# Patient Record
Sex: Female | Born: 1980 | Race: White | Hispanic: No | Marital: Single | Smoking: Current every day smoker
Health system: Southern US, Community
[De-identification: ages and names within clinical notes are randomized; demographics above are authoritative.]

## PROBLEM LIST (undated history)

## (undated) DIAGNOSIS — J302 Other seasonal allergic rhinitis: Secondary | ICD-10-CM

## (undated) DIAGNOSIS — E7800 Pure hypercholesterolemia, unspecified: Secondary | ICD-10-CM

## (undated) DIAGNOSIS — E079 Disorder of thyroid, unspecified: Secondary | ICD-10-CM

## (undated) DIAGNOSIS — G35 Multiple sclerosis: Secondary | ICD-10-CM

## (undated) DIAGNOSIS — F209 Schizophrenia, unspecified: Secondary | ICD-10-CM

## (undated) DIAGNOSIS — F3110 Bipolar disorder, current episode manic without psychotic features, unspecified: Secondary | ICD-10-CM

## (undated) HISTORY — PX: CHOLECYSTECTOMY: SHX55

---

## 2016-04-13 ENCOUNTER — Inpatient Hospital Stay (HOSPITAL_COMMUNITY)
Admission: EM | Admit: 2016-04-13 | Discharge: 2016-04-18 | DRG: 060 | Disposition: A | Discharge status: Home Health | Payer: Medicare Other | Attending: Internal Medicine | Admitting: Internal Medicine

## 2016-04-13 ENCOUNTER — Emergency Department (HOSPITAL_COMMUNITY): Payer: Medicare Other

## 2016-04-13 ENCOUNTER — Encounter (HOSPITAL_COMMUNITY): Payer: Self-pay | Admitting: Emergency Medicine

## 2016-04-13 DIAGNOSIS — G35 Multiple sclerosis: Principal | ICD-10-CM | POA: Diagnosis present

## 2016-04-13 DIAGNOSIS — J302 Other seasonal allergic rhinitis: Secondary | ICD-10-CM | POA: Diagnosis present

## 2016-04-13 DIAGNOSIS — E7800 Pure hypercholesterolemia, unspecified: Secondary | ICD-10-CM | POA: Diagnosis present

## 2016-04-13 DIAGNOSIS — F209 Schizophrenia, unspecified: Secondary | ICD-10-CM | POA: Diagnosis present

## 2016-04-13 DIAGNOSIS — F319 Bipolar disorder, unspecified: Secondary | ICD-10-CM | POA: Diagnosis present

## 2016-04-13 DIAGNOSIS — Z6834 Body mass index (BMI) 34.0-34.9, adult: Secondary | ICD-10-CM

## 2016-04-13 DIAGNOSIS — R531 Weakness: Secondary | ICD-10-CM | POA: Diagnosis present

## 2016-04-13 DIAGNOSIS — E039 Hypothyroidism, unspecified: Secondary | ICD-10-CM | POA: Diagnosis present

## 2016-04-13 DIAGNOSIS — Z23 Encounter for immunization: Secondary | ICD-10-CM

## 2016-04-13 DIAGNOSIS — Z7951 Long term (current) use of inhaled steroids: Secondary | ICD-10-CM | POA: Diagnosis not present

## 2016-04-13 DIAGNOSIS — E663 Overweight: Secondary | ICD-10-CM | POA: Diagnosis present

## 2016-04-13 DIAGNOSIS — F17210 Nicotine dependence, cigarettes, uncomplicated: Secondary | ICD-10-CM | POA: Diagnosis present

## 2016-04-13 DIAGNOSIS — H499 Unspecified paralytic strabismus: Secondary | ICD-10-CM | POA: Diagnosis present

## 2016-04-13 DIAGNOSIS — H5500 Unspecified nystagmus: Secondary | ICD-10-CM | POA: Diagnosis present

## 2016-04-13 DIAGNOSIS — Z91018 Allergy to other foods: Secondary | ICD-10-CM

## 2016-04-13 DIAGNOSIS — H50111 Monocular exotropia, right eye: Secondary | ICD-10-CM | POA: Diagnosis present

## 2016-04-13 DIAGNOSIS — T380X5A Adverse effect of glucocorticoids and synthetic analogues, initial encounter: Secondary | ICD-10-CM | POA: Diagnosis present

## 2016-04-13 DIAGNOSIS — R471 Dysarthria and anarthria: Secondary | ICD-10-CM | POA: Diagnosis present

## 2016-04-13 DIAGNOSIS — F200 Paranoid schizophrenia: Secondary | ICD-10-CM | POA: Diagnosis not present

## 2016-04-13 DIAGNOSIS — Z79899 Other long term (current) drug therapy: Secondary | ICD-10-CM

## 2016-04-13 DIAGNOSIS — R270 Ataxia, unspecified: Secondary | ICD-10-CM | POA: Diagnosis present

## 2016-04-13 DIAGNOSIS — E785 Hyperlipidemia, unspecified: Secondary | ICD-10-CM | POA: Diagnosis present

## 2016-04-13 HISTORY — DX: Multiple sclerosis: G35

## 2016-04-13 HISTORY — DX: Pure hypercholesterolemia, unspecified: E78.00

## 2016-04-13 HISTORY — DX: Bipolar disorder, current episode manic without psychotic features, unspecified: F31.10

## 2016-04-13 HISTORY — DX: Schizophrenia, unspecified: F20.9

## 2016-04-13 HISTORY — DX: Other seasonal allergic rhinitis: J30.2

## 2016-04-13 HISTORY — DX: Disorder of thyroid, unspecified: E07.9

## 2016-04-13 LAB — CBC WITH DIFFERENTIAL/PLATELET
BASOS PCT: 0 %
Basophils Absolute: 0 10*3/uL (ref 0.0–0.1)
Eosinophils Absolute: 0.1 10*3/uL (ref 0.0–0.7)
Eosinophils Relative: 1 %
HEMATOCRIT: 42.4 % (ref 36.0–46.0)
Hemoglobin: 14.6 g/dL (ref 12.0–15.0)
Lymphocytes Relative: 28 %
Lymphs Abs: 1.8 10*3/uL (ref 0.7–4.0)
MCH: 31.3 pg (ref 26.0–34.0)
MCHC: 34.4 g/dL (ref 30.0–36.0)
MCV: 91 fL (ref 78.0–100.0)
MONO ABS: 0.4 10*3/uL (ref 0.1–1.0)
MONOS PCT: 7 %
NEUTROS ABS: 4 10*3/uL (ref 1.7–7.7)
Neutrophils Relative %: 64 %
Platelets: 205 10*3/uL (ref 150–400)
RBC: 4.66 MIL/uL (ref 3.87–5.11)
RDW: 12.5 % (ref 11.5–15.5)
WBC: 6.3 10*3/uL (ref 4.0–10.5)

## 2016-04-13 LAB — MAGNESIUM: Magnesium: 2.1 mg/dL (ref 1.7–2.4)

## 2016-04-13 LAB — URINALYSIS, ROUTINE W REFLEX MICROSCOPIC
Bilirubin Urine: NEGATIVE
Glucose, UA: NEGATIVE mg/dL
Hgb urine dipstick: NEGATIVE
Ketones, ur: 15 mg/dL — AB
LEUKOCYTES UA: NEGATIVE
NITRITE: NEGATIVE
PROTEIN: NEGATIVE mg/dL
Specific Gravity, Urine: 1.01 (ref 1.005–1.030)
pH: 6 (ref 5.0–8.0)

## 2016-04-13 LAB — COMPREHENSIVE METABOLIC PANEL
ALBUMIN: 4.2 g/dL (ref 3.5–5.0)
ALT: 27 U/L (ref 14–54)
ANION GAP: 10 (ref 5–15)
AST: 26 U/L (ref 15–41)
Alkaline Phosphatase: 111 U/L (ref 38–126)
BILIRUBIN TOTAL: 0.5 mg/dL (ref 0.3–1.2)
BUN: 10 mg/dL (ref 6–20)
CO2: 20 mmol/L — ABNORMAL LOW (ref 22–32)
Calcium: 9.1 mg/dL (ref 8.9–10.3)
Chloride: 107 mmol/L (ref 101–111)
Creatinine, Ser: 0.82 mg/dL (ref 0.44–1.00)
GFR calc Af Amer: >60 mL/min (ref >60)
Glucose, Bld: 102 mg/dL — ABNORMAL HIGH (ref 65–99)
POTASSIUM: 3.6 mmol/L (ref 3.5–5.1)
Sodium: 137 mmol/L (ref 135–145)
TOTAL PROTEIN: 7.6 g/dL (ref 6.5–8.1)

## 2016-04-13 LAB — PHOSPHORUS: Phosphorus: 4.2 mg/dL (ref 2.5–4.6)

## 2016-04-13 LAB — TSH: TSH: 2.267 u[IU]/mL (ref 0.350–4.500)

## 2016-04-13 LAB — CK: CK TOTAL: 89 U/L (ref 38–234)

## 2016-04-13 LAB — VALPROIC ACID LEVEL: VALPROIC ACID LVL: 106 ug/mL — AB (ref 50.0–100.0)

## 2016-04-13 MED ORDER — LEVOTHYROXINE SODIUM 25 MCG PO TABS
25.0000 ug | ORAL_TABLET | Freq: Every day | ORAL | Status: DC
  Administered 2016-04-14 – 2016-04-18 (×5): 25 ug via ORAL
  Filled 2016-04-13 (×5): qty 1

## 2016-04-13 MED ORDER — POTASSIUM CHLORIDE IN NACL 20-0.9 MEQ/L-% IV SOLN
INTRAVENOUS | Status: DC
  Administered 2016-04-13 – 2016-04-16 (×8): via INTRAVENOUS
  Administered 2016-04-17: 1000 mL via INTRAVENOUS
  Administered 2016-04-17: 14:00:00 via INTRAVENOUS

## 2016-04-13 MED ORDER — LORATADINE 10 MG PO TABS
10.0000 mg | ORAL_TABLET | Freq: Every day | ORAL | Status: DC
  Administered 2016-04-14 – 2016-04-18 (×5): 10 mg via ORAL
  Filled 2016-04-13 (×5): qty 1

## 2016-04-13 MED ORDER — TOPIRAMATE 25 MG PO TABS
50.0000 mg | ORAL_TABLET | Freq: Three times a day (TID) | ORAL | Status: DC
  Administered 2016-04-13 – 2016-04-18 (×15): 50 mg via ORAL
  Filled 2016-04-13 (×16): qty 2

## 2016-04-13 MED ORDER — PERPHENAZINE 8 MG PO TABS
8.0000 mg | ORAL_TABLET | Freq: Two times a day (BID) | ORAL | Status: DC
  Administered 2016-04-13 – 2016-04-18 (×10): 8 mg via ORAL
  Filled 2016-04-13 (×12): qty 1

## 2016-04-13 MED ORDER — GADOBENATE DIMEGLUMINE 529 MG/ML IV SOLN
15.0000 mL | Freq: Once | INTRAVENOUS | Status: AC | PRN
  Administered 2016-04-13: 15 mL via INTRAVENOUS

## 2016-04-13 MED ORDER — FLUTICASONE PROPIONATE 50 MCG/ACT NA SUSP
2.0000 | Freq: Two times a day (BID) | NASAL | Status: DC
  Administered 2016-04-14 – 2016-04-18 (×8): 2 via NASAL
  Filled 2016-04-13: qty 16

## 2016-04-13 MED ORDER — SODIUM CHLORIDE 0.9 % IV SOLN
1000.0000 mg | INTRAVENOUS | Status: DC
  Administered 2016-04-13 – 2016-04-17 (×5): 1000 mg via INTRAVENOUS
  Filled 2016-04-13 (×6): qty 8

## 2016-04-13 MED ORDER — SODIUM CHLORIDE 0.9 % IV BOLUS (SEPSIS)
500.0000 mL | Freq: Once | INTRAVENOUS | Status: AC
  Administered 2016-04-13: 500 mL via INTRAVENOUS

## 2016-04-13 MED ORDER — ATORVASTATIN CALCIUM 10 MG PO TABS
10.0000 mg | ORAL_TABLET | Freq: Every day | ORAL | Status: DC
  Administered 2016-04-13 – 2016-04-17 (×5): 10 mg via ORAL
  Filled 2016-04-13 (×5): qty 1

## 2016-04-13 MED ORDER — INFLUENZA VAC SPLIT QUAD 0.5 ML IM SUSY
0.5000 mL | PREFILLED_SYRINGE | INTRAMUSCULAR | Status: AC
  Administered 2016-04-14: 0.5 mL via INTRAMUSCULAR
  Filled 2016-04-13: qty 0.5

## 2016-04-13 MED ORDER — HALOPERIDOL LACTATE 5 MG/ML IJ SOLN: 5.0000 mg | Freq: Four times a day (QID) | INTRAMUSCULAR | Status: DC | PRN

## 2016-04-13 MED ORDER — METHYLPREDNISOLONE SODIUM SUCC 1000 MG IJ SOLR
INTRAMUSCULAR | Status: AC
  Filled 2016-04-13: qty 8

## 2016-04-13 MED ORDER — ENOXAPARIN SODIUM 40 MG/0.4ML ~~LOC~~ SOLN
40.0000 mg | SUBCUTANEOUS | Status: DC
  Administered 2016-04-13 – 2016-04-17 (×5): 40 mg via SUBCUTANEOUS
  Filled 2016-04-13 (×5): qty 0.4

## 2016-04-13 MED ORDER — OLANZAPINE 5 MG PO TBDP
20.0000 mg | ORAL_TABLET | Freq: Every day | ORAL | Status: DC
  Administered 2016-04-13 – 2016-04-17 (×5): 20 mg via ORAL
  Filled 2016-04-13 (×5): qty 4
  Filled 2016-04-13: qty 2

## 2016-04-13 MED ORDER — DIVALPROEX SODIUM ER 500 MG PO TB24
1000.0000 mg | ORAL_TABLET | Freq: Every day | ORAL | Status: DC
  Administered 2016-04-13 – 2016-04-17 (×5): 1000 mg via ORAL
  Filled 2016-04-13 (×5): qty 2

## 2016-04-13 NOTE — Consult Note (Signed)
[place] A. [name], MD     [email or web address]          Denise Floyd is an 35 y.o. female.   ASSESSMENT/PLAN: 1. Multiple sclerosis with acute relapse presented with ophthalmoplegia, dysarthria and gait ataxia. MRI unfortunately shows severe disease burden thereby increasing the risk of long-term disability and severe neurological function. The patient will be given high-dose steroids Solu-Medrol 1 g. This would be done for 5 days given the severe burden seen on MRI. We will try to get records from her prior neurologist in [place]. Ultimately, she needs to be on disease modifying treatment. Given the heavy disease burden, I prefer that she be placed on Tysabri. This will be decided in outpatient setting however. We will attempt to obtain notes from her previous neurologist in [place].  2. Significant psychiatric problems including bipolar disorder with mania and the schizophrenia. Unfortunate, high-dose steroids have made these worse over the years especially bolus steroids given during relapses. We will therefore have to watch patient closely while she is getting high-dose steroids. We may have to give the patient as needed doses of Haldol or other neuroleptics help with mania or other symptoms that she will likely develop as a result of high-dose steroids.  3. Hypothyroidism.  4. Slightly elevated Depakote level. I will continue with the current dose however without reducing the dosing.    The patient is a 35 year old African female who has a history of multiple sclerosis. The history is obtained from the patient although very difficult. I did contact the mother who provided more information. Mother is from [place] in [place] which makes a history somewhat difficult. The patient probably was diagnosed multiple scratches at age of 35 years old. She was living in [place]. She did have a workup at that time. The patient informs me that she did have a spinal tap.  She has significant perseveration which makes the history difficult. The mother reports that the patient was given an injection. Exactly which injection this was is unclear but the patient had significant side effects from medication degree of muscle cramps. She therefore did not continue this. She seemed not to have been on consistent disease modifying therapy. The mother for his me that the patient has been getting doses of Solu-Medrol/steroids for her treatment whenever she has relapses. Unfortunately, this has caused the patient to develop psychiatric issues per the mother. She has had progressive deterioration in cognitive function especially with the high-dose steroids. The psychiatric problems seemed to worsen with steroid boluses. It appears the family moved from [place] to [place]. The patient tells me that she does have a college degree in [place] from a school in [place]. The patient has been in a group home facility for the past year. Mother relates this to me over the phone in tears that she had put her in a group home because she had difficulties taking care of the daughter with all the appointments needed. It appears over the last 2 weeks she's had significant speech impairment, ophthalmic problems and gait instability. This resulted the patient comes to the emergency room for further evaluation. The mother clearly reports that her speech has gotten worse when she saw her 2 weeks ago. She was clearly having what appears to be perseveration. It appears that there were some attempts made to get the patient on tecferida for there are one the newer oral agents but the patient got sick and this cannot be initiated. The patient complains of diplopia. She reports  having this about 3 weeks of the history is somewhat unreliable.    GENERAL: This a very pleasant overweight female in no acute distress. She seemed to have the typical [place] indifference from [place].  HEENT: Normal  ABDOMEN:  soft  EXTREMITIES: No edema   BACK: Normal  SKIN: Normal by inspection.    MENTAL STATUS: She is awake and alert. Again, she has severe perseveration which limits evaluation. Given this perseveration, she is unable to give a good history or follow commands consistently. She thinks she is in [place] at the hospital.  CRANIAL NERVES: Right pupil is 5 mm and briskly reactive. Left is 4 mm and reactive. She has a mild ptosis on the left. There is a marked right exotropia in neutral position. She has significant eye movement abnormalities with no upgaze. Downgaze is good. There is no movements of both eyes past midline to the left. She can cross midline with passive eye movements to about 80%. There is significant gaze evoked nystagmus. There is no flattening of the nasolabial folds; tongue is midline; uvula is midline; shoulder elevation is normal.  MOTOR: Normal tone, bulk and strength in the upper extremities; no pronator drift. Right hip flexion is 4/5. Dorsiflexion 5. Left lower extremity is 5/5 both dorsiflexion and plantar flexion.  COORDINATION: Left finger to nose is normal, right finger to nose is normal, No rest tremor; no intention tremor; no postural tremor; no bradykinesia.  REFLEXES: Deep tendon reflexes are symmetrical and normal. Babinski reflexes are flexor bilaterally.   SENSATION: Normal to light touch.  GAIT: This is slightly unsteady.     The brain MRI is reviewed in person. There are multiple large plaques seen involving both hemispheres. There are mostly perpendicular to the ventricle. I counted about 30 large plaques. This includes a large midline midbrain plaque affecting both sides. There is also confluent periventricular leukoencephalopathy. This involves most of the posterior horn of the lateral ventricle. No contrast enhancement is appreciated. No hemorrhage. No black spots/encephalomalacia appreciated.   Blood pressure 125/89, pulse 81, temperature 97.7 F  (36.5 C), temperature source Oral, resp. rate 18, height 5' 5"  (1.651 m), weight 205 lb 4 oz (93.1 kg), SpO2 99 %.  Past Medical History:  Diagnosis Date  . Bipolar I disorder with mania ([place])   . High cholesterol   . Multiple sclerosis ([place])   . Schizophrenia ([place])   . Seasonal allergies   . Thyroid disease     Past Surgical History:  Procedure Laterality Date  . CHOLECYSTECTOMY      History reviewed. No pertinent family history.  Social History:  reports that she has been smoking Cigarettes.  She has been smoking about 0.50 packs per day. She has never used smokeless tobacco. She reports that she does not drink alcohol or use drugs.  Allergies:  Allergies  Allergen Reactions  . Pineapple Nausea And Vomiting    Medications: Prior to Admission medications   Medication Sig Start Date End Date Taking? Authorizing Provider  atorvastatin (LIPITOR) 10 MG tablet Take 10 mg by mouth at bedtime.   Yes Historical Provider, MD  cetirizine (ZYRTEC) 10 MG tablet Take 10 mg by mouth daily.   Yes Historical Provider, MD  divalproex (DEPAKOTE ER) 500 MG 24 hr tablet Take 1,000 mg by mouth at bedtime.   Yes Historical Provider, MD  fluticasone (FLONASE) 50 MCG/ACT nasal spray Place 2 sprays into both nostrils 2 (two) times daily.   Yes Historical Provider, MD  levothyroxine (SYNTHROID, LEVOTHROID)  25 MCG tablet Take 25 mcg by mouth daily.   Yes Historical Provider, MD  OLANZapine zydis (ZYPREXA) 20 MG disintegrating tablet Take 20 mg by mouth at bedtime.   Yes Historical Provider, MD  perphenazine (TRILAFON) 4 MG tablet Take 8 mg by mouth 2 (two) times daily.   Yes Historical Provider, MD  topiramate (TOPAMAX) 50 MG tablet Take 50 mg by mouth 3 (three) times daily.   Yes Historical Provider, MD    Scheduled Meds: . atorvastatin  10 mg Oral QHS  . divalproex  1,000 mg Oral QHS  . enoxaparin (LOVENOX) injection  40 mg Subcutaneous Q24H  . fluticasone  2 spray Each Nare BID  . [START ON  04/14/2016] Influenza vac split quadrivalent PF  0.5 mL Intramuscular Tomorrow-1000  . [START ON 04/14/2016] levothyroxine  25 mcg Oral QAC breakfast  . [START ON 04/14/2016] loratadine  10 mg Oral Daily  . OLANZapine zydis  20 mg Oral QHS  . perphenazine  8 mg Oral BID  . topiramate  50 mg Oral TID   Continuous Infusions: . 0.9 % NaCl with KCl 20 mEq / L 125 mL/hr at 04/13/16 1749   PRN Meds:.     Results for orders placed or performed during the hospital encounter of 04/13/16 (from the past 48 hour(s))  Urinalysis, Routine w reflex microscopic (not at [facility])     Status: Abnormal   Collection Time: 04/13/16  2:00 PM  Result Value Ref Range   Color, Urine YELLOW YELLOW   APPearance CLEAR CLEAR   Specific Gravity, Urine 1.010 1.005 - 1.030   pH 6.0 5.0 - 8.0   Glucose, UA NEGATIVE NEGATIVE mg/dL   Hgb urine dipstick NEGATIVE NEGATIVE   Bilirubin Urine NEGATIVE NEGATIVE   Ketones, ur 15 (A) NEGATIVE mg/dL   Protein, ur NEGATIVE NEGATIVE mg/dL   Nitrite NEGATIVE NEGATIVE   Leukocytes, UA NEGATIVE NEGATIVE    Comment: MICROSCOPIC NOT DONE ON URINES WITH NEGATIVE PROTEIN, BLOOD, LEUKOCYTES, NITRITE, OR GLUCOSE <1000 mg/dL.  Magnesium     Status: None   Collection Time: 04/13/16  2:00 PM  Result Value Ref Range   Magnesium 2.1 1.7 - 2.4 mg/dL  Phosphorus     Status: None   Collection Time: 04/13/16  2:00 PM  Result Value Ref Range   Phosphorus 4.2 2.5 - 4.6 mg/dL  Valproic acid level     Status: Abnormal   Collection Time: 04/13/16  2:00 PM  Result Value Ref Range   Valproic Acid Lvl 106 (H) 50.0 - 100.0 ug/mL  CK     Status: None   Collection Time: 04/13/16  2:00 PM  Result Value Ref Range   Total CK 89 38 - 234 U/L  CBC with Differential/Platelet     Status: None   Collection Time: 04/13/16  2:36 PM  Result Value Ref Range   WBC 6.3 4.0 - 10.5 K/uL   RBC 4.66 3.87 - 5.11 MIL/uL   Hemoglobin 14.6 12.0 - 15.0 g/dL   HCT 42.4 36.0 - 46.0 %   MCV 91.0 78.0 - 100.0 fL   MCH  31.3 26.0 - 34.0 pg   MCHC 34.4 30.0 - 36.0 g/dL   RDW 12.5 11.5 - 15.5 %   Platelets 205 150 - 400 K/uL   Neutrophils Relative % 64 %   Neutro Abs 4.0 1.7 - 7.7 K/uL   Lymphocytes Relative 28 %   Lymphs Abs 1.8 0.7 - 4.0 K/uL   Monocytes Relative 7 %  Monocytes Absolute 0.4 0.1 - 1.0 K/uL   Eosinophils Relative 1 %   Eosinophils Absolute 0.1 0.0 - 0.7 K/uL   Basophils Relative 0 %   Basophils Absolute 0.0 0.0 - 0.1 K/uL  Comprehensive metabolic panel     Status: Abnormal   Collection Time: 04/13/16  2:36 PM  Result Value Ref Range   Sodium 137 135 - 145 mmol/L   Potassium 3.6 3.5 - 5.1 mmol/L   Chloride 107 101 - 111 mmol/L   CO2 20 (L) 22 - 32 mmol/L   Glucose, Bld 102 (H) 65 - 99 mg/dL   BUN 10 6 - 20 mg/dL   Creatinine, Ser 0.82 0.44 - 1.00 mg/dL   Calcium 9.1 8.9 - 10.3 mg/dL   Total Protein 7.6 6.5 - 8.1 g/dL   Albumin 4.2 3.5 - 5.0 g/dL   AST 26 15 - 41 U/L   ALT 27 14 - 54 U/L   Alkaline Phosphatase 111 38 - 126 U/L   Total Bilirubin 0.5 0.3 - 1.2 mg/dL   GFR calc non Af Amer >60 >60 mL/min   GFR calc Af Amer >60 >60 mL/min    Comment: (NOTE) The eGFR has been calculated using the CKD EPI equation. This calculation has not been validated in all clinical situations. eGFR's persistently <60 mL/min signify possible Chronic Kidney Disease.    Anion gap 10 5 - 15  TSH     Status: None   Collection Time: 04/13/16  2:36 PM  Result Value Ref Range   TSH 2.267 0.350 - 4.500 uIU/mL    Studies/Results:    BRAIN MRI COMPARISON:  None.  FINDINGS: The study is mildly motion degraded.  Brain: There is no evidence of acute infarct, intracranial hemorrhage, mass, midline shift, or extra-axial fluid collection. The ventricles are slightly prominent in size for age and may reflect mild cerebral volume loss. There are patchy T2 hyperintensities in the cerebral white matter bilaterally which are more confluent in the periventricular regions. Multiple lesions  are oriented perpendicularly to the lateral ventricles. There are juxtacortical lesions. There are lesions in the left middle cerebellar peduncle and brainstem, including a prominent 1.2 cm lesion in the midbrain. A left frontal lobe lesion demonstrates T2 shine through on diffusion imaging. No enhancing lesions are identified.  Vascular: Major intracranial vascular flow voids are preserved.  Skull and upper cervical spine: Unremarkable bone marrow signal.  Sinuses/Orbits: Orbits are unremarkable on this nondedicated study. Minimal paranasal sinus mucosal thickening. Clear mastoid air cells.  Other: None.  IMPRESSION: Moderately extensive white matter lesions consistent with provided history of multiple sclerosis. No evidence of active demyelination           Luria Rosario A. [name], M.D.  Diplomate, Tax adviser of Psychiatry and Neurology ( Neurology). 04/13/2016, 6:17 PM

## 2016-04-13 NOTE — H&P (Signed)
History and Physical    Denise Floyd ZOX:[id number] DOB: 1980/10/31 DOA: 04/13/2016  PCP: No primary care provider on file.   Patient coming from: Group Home.  Chief Complaint: Generalized weakness.  HPI: Denise Floyd is a 35 y.o. female with medical history significant of bipolar 1 disorder, schizophrenia, hypothyroidism, hyperlipidemia, seasonal allergies, Multiple sclerosis who is brought to the ER via [facility] from her group home due to generalized weakness for more than week.   History is difficult to obtain due to the patient's symptoms and speech repetition. Per Ms. Meyer Cory from the group home [phone]), since last week the patient has had progressively worse strabismus, generalized weakness, speech difficulty and repetition. No trauma, fever or any other symptoms noticed. She has had normal appetite and sleep.   ED Course: Work up in the ER showed a normal CBC, unremarkable CMP with mildly decreased CO2 level at 20 mmol/L and mild non- fasting hyperglycemia at 102 mg/dL. MRI of brain did not show any active demyelination, but shows extensive white matter lesions consistent with her MS history.   Review of Systems: As per HPI otherwise 10 point review of systems negative.    Past Medical History:  Diagnosis Date  . Bipolar I disorder with mania ([facility])   . High cholesterol   . Multiple sclerosis ([facility])   . Schizophrenia ([facility])   . Seasonal allergies   . Thyroid disease     Past Surgical History:  Procedure Laterality Date  . CHOLECYSTECTOMY       reports that she has been smoking Cigarettes.  She has been smoking about 0.50 packs per day. She has never used smokeless tobacco. She reports that she does not drink alcohol or use drugs.  Allergies  Allergen Reactions  . Pineapple Nausea And Vomiting   Family history Unable to obtain from the patient since she is constantly repeating the same things often.   Prior to Admission medications     Medication Sig Start Date End Date Taking? Authorizing Provider  atorvastatin (LIPITOR) 10 MG tablet Take 10 mg by mouth at bedtime.   Yes Historical Provider, MD  cetirizine (ZYRTEC) 10 MG tablet Take 10 mg by mouth daily.   Yes Historical Provider, MD  divalproex (DEPAKOTE ER) 500 MG 24 hr tablet Take 1,000 mg by mouth at bedtime.   Yes Historical Provider, MD  fluticasone (FLONASE) 50 MCG/ACT nasal spray Place 2 sprays into both nostrils 2 (two) times daily.   Yes Historical Provider, MD  levothyroxine (SYNTHROID, LEVOTHROID) 25 MCG tablet Take 25 mcg by mouth daily.   Yes Historical Provider, MD  OLANZapine zydis (ZYPREXA) 20 MG disintegrating tablet Take 20 mg by mouth at bedtime.   Yes Historical Provider, MD  perphenazine (TRILAFON) 4 MG tablet Take 8 mg by mouth 2 (two) times daily.   Yes Historical Provider, MD  topiramate (TOPAMAX) 50 MG tablet Take 50 mg by mouth 3 (three) times daily.   Yes Historical Provider, MD    Physical Exam:  Constitutional: NAD, calm, comfortable Vitals:   04/13/16 1326 04/13/16 1408 04/13/16 1430 04/13/16 1500  BP: 127/81 107/72 104/70 110/82  Pulse: 88 85 87 81  Resp: 17 18 18 18   Temp: 97.8 F (36.6 C)     TempSrc: Oral     SpO2: 100% 100% 100% 100%  Weight: 83.9 kg (185 lb)     Height: 5\' 5"  (1.651 m)      Eyes: PERRL, lids and conjunctivae normal ENMT: Mucous membranes are moist.  Posterior pharynx clear of any exudate or lesions. Dentition in fair-good condition and shows previous signs of repair on lower molar.  Neck: normal, supple, no masses, no thyromegaly Respiratory: clear to auscultation bilaterally, no wheezing, no crackles. Normal respiratory effort. No accessory muscle use.  Cardiovascular: Regular rate and rhythm, no murmurs / rubs / gallops. No extremity edema. 2+ pedal pulses. No carotid bruits.  Abdomen: no tenderness, no masses palpated. No hepatosplenomegaly. Bowel sounds positive.  Musculoskeletal: no clubbing / cyanosis. No  joint deformity upper and lower extremities. Good ROM, no contractures. Normal muscle tone.  Skin: no rashes, lesions, ulcers. No induration Neurologic: Sitting in wheelchair, positive strabismus, generalized weakness, intact coordination with nose to finger, unable to evaluate gait. Psychiatric:  No restlessness, but patient seems frustrated with symptoms. Speech repetition and difficulty. Unable to fully evaluate.   Labs on Admission: I have personally reviewed following labs and imaging studies  CBC:  Recent Labs Lab 04/13/16 1436  WBC 6.3  NEUTROABS 4.0  HGB 14.6  HCT 42.4  MCV 91.0  PLT 205   Basic Metabolic Panel:  Recent Labs Lab 04/13/16 1436  NA 137  K 3.6  CL 107  CO2 20*  GLUCOSE 102*  BUN 10  CREATININE 0.82  CALCIUM 9.1   GFR: Estimated Creatinine Clearance: 103.5 mL/min (by C-G formula based on SCr of 0.82 mg/dL). Liver Function Tests:  Recent Labs Lab 04/13/16 1436  AST 26  ALT 27  ALKPHOS 111  BILITOT 0.5  PROT 7.6  ALBUMIN 4.2   No results for input(s): LIPASE, AMYLASE in the last 168 hours. No results for input(s): AMMONIA in the last 168 hours. Coagulation Profile: No results for input(s): INR, PROTIME in the last 168 hours. Cardiac Enzymes: No results for input(s): CKTOTAL, CKMB, CKMBINDEX, TROPONINI in the last 168 hours. BNP (last 3 results) No results for input(s): PROBNP in the last 8760 hours. HbA1C: No results for input(s): HGBA1C in the last 72 hours. CBG: No results for input(s): GLUCAP in the last 168 hours. Lipid Profile: No results for input(s): CHOL, HDL, LDLCALC, TRIG, CHOLHDL, LDLDIRECT in the last 72 hours. Thyroid Function Tests: No results for input(s): TSH, T4TOTAL, FREET4, T3FREE, THYROIDAB in the last 72 hours. Anemia Panel: No results for input(s): VITAMINB12, FOLATE, FERRITIN, TIBC, IRON, RETICCTPCT in the last 72 hours. Urine analysis: No results found for: COLORURINE, APPEARANCEUR, LABSPEC, PHURINE,  GLUCOSEU, HGBUR, BILIRUBINUR, KETONESUR, PROTEINUR, UROBILINOGEN, NITRITE, LEUKOCYTESUR   Radiological Exams on Admission:  CLINICAL DATA:  Weakness over the past week with difficulty ambulating. History of multiple sclerosis.  EXAM: MRI HEAD WITHOUT AND WITH CONTRAST  TECHNIQUE: Multiplanar, multiecho pulse sequences of the brain and surrounding structures were obtained without and with intravenous contrast.  CONTRAST:  15mL MULTIHANCE GADOBENATE DIMEGLUMINE 529 MG/ML IV SOLN  COMPARISON:  None.  FINDINGS: The study is mildly motion degraded.  Brain: There is no evidence of acute infarct, intracranial hemorrhage, mass, midline shift, or extra-axial fluid collection. The ventricles are slightly prominent in size for age and may reflect mild cerebral volume loss. There are patchy T2 hyperintensities in the cerebral white matter bilaterally which are more confluent in the periventricular regions. Multiple lesions are oriented perpendicularly to the lateral ventricles. There are juxtacortical lesions. There are lesions in the left middle cerebellar peduncle and brainstem, including a prominent 1.2 cm lesion in the midbrain. A left frontal lobe lesion demonstrates T2 shine through on diffusion imaging. No enhancing lesions are identified.  Vascular: Major intracranial vascular flow  voids are preserved.  Skull and upper cervical spine: Unremarkable bone marrow signal.  Sinuses/Orbits: Orbits are unremarkable on this nondedicated study. Minimal paranasal sinus mucosal thickening. Clear mastoid air cells.  Other: None.  IMPRESSION: Moderately extensive white matter lesions consistent with provided history of multiple sclerosis. No evidence of active demyelination.   Electronically Signed   By: Sebastian Ache M.D.   On: 04/13/2016 17:14    Assessment/Plan Principal Problem:   Multiple sclerosis exacerbation ([facility]) History difficult to obtain due to  patient's symptoms. Admit to MedSurg/ Inpatient. Neuro checks every 4 hours. Solumedrol 1 gr IVPB every 24 hrs x 3 days. Check phosphorus, total CK, valproic acid level. Neurology is on the case.Marland Kitchen   Active Problems:   Hyperlipidemia Continue atorvastatin 10 mg po daily. Check total CK level to rule out rhabdomyolysis as a cause weakness. Monitor LFT's periodically.    Schizophrenia (HCC) Continue pherphenazine 8 mg po bid. Continue Olanzapine 20 mg po at bed time. Haldol 5 mg PRN for corticosteroids induced restlessness.    Bipolar 1 disorder ([facility]CC) Continue valproic acid. Check valproic acid level.    Seasonal allergies Continue Zyrtec or formulary equivalent.    Hypothyroidism Continue Levothyroxine 25 mcg po daily. Continue interval TSH monitoring.   DVT prophylaxis: Lovenox SQ. Code Status: Full code. Family Communication:  Disposition Plan: Admit for  Consults called: Neurology (Dr. Judithann Sheen) Admission status: Inpatient/Medsurg.   Bobette Mo MD [facility] Pager [phone].  If 7PM-7AM, please contact night-coverage [email or web address] Password [email or web address]  04/13/2016, 4:13 PM

## 2016-04-13 NOTE — ED Provider Notes (Signed)
D/w Dr. Robb Matarrtiz - will admit   Eber HongBrian Myers [place], MD 04/13/16 [phone]

## 2016-04-13 NOTE — ED Notes (Signed)
PT lives at [facility] (group home) and Meyer Cory was updated that pt would be admitted to room 334. Phone number for group home [phone].

## 2016-04-13 NOTE — ED Triage Notes (Signed)
[facility] brought pt in today with generalized weakness for over a week from a group home. PT states she has multiple sclerosis and needs steroids and feels weak all over and had problems walking for the past few days. PT denies any pain at this time.

## 2016-04-13 NOTE — ED Provider Notes (Signed)
[facility]Y DEPT Provider Note   CSN: [id number] Arrival date & time: 04/13/16  1322     History   Chief Complaint Chief Complaint  Patient presents with  . Weakness    HPI Denise Floyd is a 35 y.o. female.  Patient with MS history, schizophrenia, lives in a group home presents for general weakness has been worsening over the past week. No focal deficits patient. No new medications. Patient has had this in the past and required steroids. Patient is had problems walking for the past few days. No current neurologist known.      Past Medical History:  Diagnosis Date  . Bipolar I disorder with mania (HCC)   . High cholesterol   . Multiple sclerosis (HCC)   . Schizophrenia (HCC)   . Seasonal allergies   . Thyroid disease     There are no active problems to display for this patient.   Past Surgical History:  Procedure Laterality Date  . CHOLECYSTECTOMY      OB History    Gravida Para Term Preterm AB Living   0 0 0 0 0 0   SAB TAB Ectopic Multiple Live Births   0 0 0 0 0       Home Medications    Prior to Admission medications   Medication Sig Start Date End Date Taking? Authorizing Provider  atorvastatin (LIPITOR) 10 MG tablet Take 10 mg by mouth at bedtime.   Yes Historical Provider, MD  cetirizine (ZYRTEC) 10 MG tablet Take 10 mg by mouth daily.   Yes Historical Provider, MD  divalproex (DEPAKOTE ER) 500 MG 24 hr tablet Take 1,000 mg by mouth at bedtime.   Yes Historical Provider, MD  fluticasone (FLONASE) 50 MCG/ACT nasal spray Place 2 sprays into both nostrils 2 (two) times daily.   Yes Historical Provider, MD  levothyroxine (SYNTHROID, LEVOTHROID) 25 MCG tablet Take 25 mcg by mouth daily.   Yes Historical Provider, MD  OLANZapine zydis (ZYPREXA) 20 MG disintegrating tablet Take 20 mg by mouth at bedtime.   Yes Historical Provider, MD  perphenazine (TRILAFON) 4 MG tablet Take 8 mg by mouth 2 (two) times daily.   Yes Historical Provider, MD    topiramate (TOPAMAX) 50 MG tablet Take 50 mg by mouth 3 (three) times daily.   Yes Historical Provider, MD    Family History History reviewed. No pertinent family history.  Social History Social History  Substance Use Topics  . Smoking status: Current Every Day Smoker    Packs/day: 0.50    Types: Cigarettes  . Smokeless tobacco: Never Used  . Alcohol use No     Allergies   Pineapple   Review of Systems Review of Systems  Constitutional: Positive for fatigue. Negative for chills and fever.  HENT: Negative for congestion.   Eyes: Negative for visual disturbance.  Respiratory: Negative for shortness of breath.   Cardiovascular: Negative for chest pain.  Gastrointestinal: Negative for abdominal pain and vomiting.  Genitourinary: Negative for dysuria and flank pain.  Musculoskeletal: Negative for back pain, neck pain and neck stiffness.  Skin: Negative for rash.  Neurological: Positive for weakness. Negative for light-headedness and headaches.     Physical Exam Updated Vital Signs BP 107/72   Pulse 85   Temp 97.8 F (36.6 C) (Oral)   Resp 18   Ht 5\' 5"  (1.651 m)   Wt 185 lb (83.9 kg)   LMP  (LMP Unknown)   SpO2 100%   BMI 30.79 kg/m  Physical Exam  Constitutional: She is oriented to person, place, and time. She appears well-developed and well-nourished.  HENT:  Head: Normocephalic and atraumatic.  Eyes: Right eye exhibits no discharge. Left eye exhibits no discharge.  Neck: Normal range of motion. Neck supple. No tracheal deviation present.  Cardiovascular: Normal rate and regular rhythm.   Pulmonary/Chest: Effort normal and breath sounds normal.  Abdominal: Soft. She exhibits no distension. There is no tenderness. There is no guarding.  Musculoskeletal: She exhibits no edema.  Neurological: She is alert and oriented to person, place, and time.  General fatigue and weakness on exam. No focal weakness in extremities. Perrl.  Right eye difficulty with medial  movement, otherwise eomfi Sensation intact in all extremities.   Skin: Skin is warm. No rash noted.  Psychiatric: Her affect is blunt.  Flat affect  Nursing note and vitals reviewed.    ED Treatments / Results  Labs (all labs ordered are listed, but only abnormal results are displayed) Labs Reviewed  CBC WITH DIFFERENTIAL/PLATELET  COMPREHENSIVE METABOLIC PANEL  URINALYSIS, ROUTINE W REFLEX MICROSCOPIC (NOT AT [facility])    EKG  EKG Interpretation None       Radiology No results found.  Procedures Procedures (including critical care time) Emergency Ultrasound Study:   Angiocath insertion Performed by: Enid Skeens  Consent: Verbal consent obtained. Risks and benefits: risks, benefits and alternatives were discussed Immediately prior to procedure the correct patient, procedure, equipment, support staff and site/side marked as needed.  Indication: difficult IV access Preparation: Patient was prepped and draped in the usual sterile fashion. Vein Location: left ac  vein was visualized during assessment for potential access sites and was found to be patent/ easily compressed with linear ultrasound.  The needle was visualized with real-time ultrasound and guided into the vein. Gauge: 20 g  Image saved and stored.  Normal blood return.  Patient tolerance: Patient tolerated the procedure well with no immediate complications.     Medications Ordered in ED Medications  sodium chloride 0.9 % bolus 500 mL (not administered)     Initial Impression / Assessment and Plan / ED Course  I have reviewed the triage vital signs and the nursing notes.  Pertinent labs & imaging results that were available during my care of the patient were reviewed by me and considered in my medical decision making (see chart for details).  Clinical Course   Patient presents from group home with worsening general weakness and ambulation issues. Concern for MS flare however other differentials  as well. Plan for screening blood work, urinalysis and MRI brain with and without contrast. Patient possibly will need neurology consult.  Difficult IV ultrasound-guided. Patient's care be signed out to follow-up results. Discussed with Dr. Judithann Sheen neurology who agreed with MRI, blood work, urine and he will consult on the patient once admitted to the hospitalist. He recommends 1 g Solu-Medrol daily for 3 days after MRI. Patient's blood work and urinalysis and MRI pending.  Final Clinical Impressions(s) / ED Diagnoses   Final diagnoses:  MS (multiple sclerosis) (HCC)  Generalized weakness    New Prescriptions New Prescriptions   No medications on file     Blane Ohara, MD 04/13/16 1456

## 2016-04-13 NOTE — ED Notes (Signed)
Pt to MRI

## 2016-04-13 NOTE — ED Notes (Signed)
Report given to [place], RN for admission at this time. PT still in MRI

## 2016-04-14 DIAGNOSIS — F200 Paranoid schizophrenia: Secondary | ICD-10-CM

## 2016-04-14 LAB — BASIC METABOLIC PANEL
ANION GAP: 8 (ref 5–15)
BUN: 8 mg/dL (ref 6–20)
CALCIUM: 9 mg/dL (ref 8.9–10.3)
CO2: 20 mmol/L — AB (ref 22–32)
Chloride: 110 mmol/L (ref 101–111)
Creatinine, Ser: 0.8 mg/dL (ref 0.44–1.00)
GFR calc Af Amer: >60 mL/min (ref >60)
GFR calc non Af Amer: >60 mL/min (ref >60)
GLUCOSE: 165 mg/dL — AB (ref 65–99)
Potassium: 4 mmol/L (ref 3.5–5.1)
Sodium: 138 mmol/L (ref 135–145)

## 2016-04-14 LAB — CBC
HEMATOCRIT: 44.5 % (ref 36.0–46.0)
HEMOGLOBIN: 15.5 g/dL — AB (ref 12.0–15.0)
MCH: 31.6 pg (ref 26.0–34.0)
MCHC: 34.8 g/dL (ref 30.0–36.0)
MCV: 90.6 fL (ref 78.0–100.0)
Platelets: 194 10*3/uL (ref 150–400)
RBC: 4.91 MIL/uL (ref 3.87–5.11)
RDW: 12.5 % (ref 11.5–15.5)
WBC: 6.8 10*3/uL (ref 4.0–10.5)

## 2016-04-14 LAB — VITAMIN B12: VITAMIN B 12: 531 pg/mL (ref 180–914)

## 2016-04-14 LAB — C-REACTIVE PROTEIN: CRP: <0.8 mg/dL (ref <1.0)

## 2016-04-14 LAB — SEDIMENTATION RATE: Sed Rate: 3 mm/hr (ref 0–22)

## 2016-04-14 NOTE — Progress Notes (Signed)
Denise A. [name], MD     [email or web address]          Denise Floyd is an 35 y.o. female.   Assessment/Plan: 1. Multiple sclerosis with acute relapse presented with ophthalmoplegia, dysarthria and gait ataxia. MRI unfortunately shows severe disease burden thereby increasing the risk of long-term disability and severe neurological function. The patient will be given high-dose steroids Solu-Medrol 1 g. This would be done for 5 days given the severe burden seen on MRI. We will try to get records from her prior neurologist in [place]. Ultimately, she needs to be on disease modifying treatment. Given the heavy disease burden, I prefer that she be placed on Tysabri. This will be decided in outpatient setting however. We will attempt to obtain notes from her previous neurologist in [place].  2. Significant psychiatric problems including bipolar disorder with mania and the schizophrenia. Unfortunate, high-dose steroids have made these worse over the years especially bolus steroids given during relapses. We will therefore have to watch patient closely while she is getting high-dose steroids. We may have to give the patient as needed doses of Haldol or other neuroleptics help with mania or other symptoms that she will likely develop as a result of high-dose steroids.  3. Hypothyroidism.  4. Slightly elevated Depakote level. I will continue with the current dose however without reducing the dosing.    I will also go ahead and order the JC virus antibody to assess the risk of immunosuppression therapy for MS.   Consideration was given to use ACTH because of steroid-induced/worsening psychosis in this patient, but currently it is not available on formulary in the hospital.  We will also do additional labs for the following: ANA, C-reactive protein, ESR, vitamin B12 level, TSH and HIV.       She reports that her symptoms are slightly improved. She still has  diplopia.    GENERAL: This a very pleasant overweight female in no acute distress. She seemed to have the typical [place] indifference from [place].  HEENT: Normal  ABDOMEN: soft  EXTREMITIES: No edema   BACK: Normal  SKIN: Normal by inspection.    MENTAL STATUS: She is awake and alert. Again, she has severe perseveration which limits evaluation. Given this perseveration, she is unable to give a good history or follow commands consistently. She thinks she is in [place] at the hospital.  CRANIAL NERVES: Right pupil is 5 mm and briskly reactive. Left is 4 mm and reactive. She has a mild ptosis on the left. There is a marked right exotropia in neutral position. She has significant eye movement abnormalities with no upgaze. Downgaze is good. There is no movements of both eyes past midline to the left. She can cross midline with passive eye movements to about 80%. There is significant gaze evoked nystagmus. There is no flattening of the nasolabial folds; tongue is midline; uvula is midline; shoulder elevation is normal.  MOTOR: Normal tone, bulk and strength in the upper extremities; no pronator drift. Right hip flexion is 4/5. Dorsiflexion 5. Left lower extremity is 5/5 both dorsiflexion and plantar flexion.  COORDINATION: Left finger to nose is normal, right finger to nose is normal, No rest tremor; no intention tremor; no postural tremor; no bradykinesia.  REFLEXES: Deep tendon reflexes are symmetrical and normal. Babinski reflexes are flexor bilaterally.   SENSATION: Normal to light touch.  GAIT: This is slightly unsteady.      Objective: Vital signs in last 24 hours: Temp:  [97.7 F (36.5  C)-98.2 F (36.8 C)] 98.2 F (36.8 C) (10/04 0522) Pulse Rate:  [81-90] 82 (10/04 0522) Resp:  [16-18] 18 (10/04 0522) BP: (104-131)/(70-89) 130/70 (10/04 0522) SpO2:  [99 %-100 %] 100 % (10/04 0522) Weight:  [185 lb (83.9 kg)-205 lb 4 oz (93.1 kg)] 205 lb 4 oz (93.1 kg)  (10/03 1740)  Intake/Output from previous day: 10/03 0701 - 10/04 0700 In: 1882.9 [P.O.:360; I.V.:1022.9; IV Piggyback:500] Out: 700 [Urine:700] Intake/Output this shift: No intake/output data recorded. Nutritional status: Diet heart healthy/carb modified Room service appropriate? Yes; Fluid consistency: Thin   Lab Results: Results for orders placed or performed during the hospital encounter of 04/13/16 (from the past 48 hour(s))  Urinalysis, Routine w reflex microscopic (not at [facility])     Status: Abnormal   Collection Time: 04/13/16  2:00 PM  Result Value Ref Range   Color, Urine YELLOW YELLOW   APPearance CLEAR CLEAR   Specific Gravity, Urine 1.010 1.005 - 1.030   pH 6.0 5.0 - 8.0   Glucose, UA NEGATIVE NEGATIVE mg/dL   Hgb urine dipstick NEGATIVE NEGATIVE   Bilirubin Urine NEGATIVE NEGATIVE   Ketones, ur 15 (A) NEGATIVE mg/dL   Protein, ur NEGATIVE NEGATIVE mg/dL   Nitrite NEGATIVE NEGATIVE   Leukocytes, UA NEGATIVE NEGATIVE    Comment: MICROSCOPIC NOT DONE ON URINES WITH NEGATIVE PROTEIN, BLOOD, LEUKOCYTES, NITRITE, OR GLUCOSE <1000 mg/dL.  Magnesium     Status: None   Collection Time: 04/13/16  2:00 PM  Result Value Ref Range   Magnesium 2.1 1.7 - 2.4 mg/dL  Phosphorus     Status: None   Collection Time: 04/13/16  2:00 PM  Result Value Ref Range   Phosphorus 4.2 2.5 - 4.6 mg/dL  Valproic acid level     Status: Abnormal   Collection Time: 04/13/16  2:00 PM  Result Value Ref Range   Valproic Acid Lvl 106 (H) 50.0 - 100.0 ug/mL  CK     Status: None   Collection Time: 04/13/16  2:00 PM  Result Value Ref Range   Total CK 89 38 - 234 U/L  CBC with Differential/Platelet     Status: None   Collection Time: 04/13/16  2:36 PM  Result Value Ref Range   WBC 6.3 4.0 - 10.5 K/uL   RBC 4.66 3.87 - 5.11 MIL/uL   Hemoglobin 14.6 12.0 - 15.0 g/dL   HCT 42.4 36.0 - 46.0 %   MCV 91.0 78.0 - 100.0 fL   MCH 31.3 26.0 - 34.0 pg   MCHC 34.4 30.0 - 36.0 g/dL   RDW 12.5 11.5 - 15.5 %     Platelets 205 150 - 400 K/uL   Neutrophils Relative % 64 %   Neutro Abs 4.0 1.7 - 7.7 K/uL   Lymphocytes Relative 28 %   Lymphs Abs 1.8 0.7 - 4.0 K/uL   Monocytes Relative 7 %   Monocytes Absolute 0.4 0.1 - 1.0 K/uL   Eosinophils Relative 1 %   Eosinophils Absolute 0.1 0.0 - 0.7 K/uL   Basophils Relative 0 %   Basophils Absolute 0.0 0.0 - 0.1 K/uL  Comprehensive metabolic panel     Status: Abnormal   Collection Time: 04/13/16  2:36 PM  Result Value Ref Range   Sodium 137 135 - 145 mmol/L   Potassium 3.6 3.5 - 5.1 mmol/L   Chloride 107 101 - 111 mmol/L   CO2 20 (L) 22 - 32 mmol/L   Glucose, Bld 102 (H) 65 - 99 mg/dL  BUN 10 6 - 20 mg/dL   Creatinine, Ser 0.82 0.44 - 1.00 mg/dL   Calcium 9.1 8.9 - 10.3 mg/dL   Total Protein 7.6 6.5 - 8.1 g/dL   Albumin 4.2 3.5 - 5.0 g/dL   AST 26 15 - 41 U/L   ALT 27 14 - 54 U/L   Alkaline Phosphatase 111 38 - 126 U/L   Total Bilirubin 0.5 0.3 - 1.2 mg/dL   GFR calc non Af Amer >60 >60 mL/min   GFR calc Af Amer >60 >60 mL/min    Comment: (NOTE) The eGFR has been calculated using the CKD EPI equation. This calculation has not been validated in all clinical situations. eGFR's persistently <60 mL/min signify possible Chronic Kidney Disease.    Anion gap 10 5 - 15  TSH     Status: None   Collection Time: 04/13/16  2:36 PM  Result Value Ref Range   TSH 2.267 0.350 - 4.500 uIU/mL  Basic metabolic panel     Status: Abnormal   Collection Time: 04/14/16  7:39 AM  Result Value Ref Range   Sodium 138 135 - 145 mmol/L   Potassium 4.0 3.5 - 5.1 mmol/L   Chloride 110 101 - 111 mmol/L   CO2 20 (L) 22 - 32 mmol/L   Glucose, Bld 165 (H) 65 - 99 mg/dL   BUN 8 6 - 20 mg/dL   Creatinine, Ser 0.80 0.44 - 1.00 mg/dL   Calcium 9.0 8.9 - 10.3 mg/dL   GFR calc non Af Amer >60 >60 mL/min   GFR calc Af Amer >60 >60 mL/min    Comment: (NOTE) The eGFR has been calculated using the CKD EPI equation. This calculation has not been validated in all clinical  situations. eGFR's persistently <60 mL/min signify possible Chronic Kidney Disease.    Anion gap 8 5 - 15  CBC     Status: Abnormal   Collection Time: 04/14/16  7:39 AM  Result Value Ref Range   WBC 6.8 4.0 - 10.5 K/uL   RBC 4.91 3.87 - 5.11 MIL/uL   Hemoglobin 15.5 (H) 12.0 - 15.0 g/dL   HCT 44.5 36.0 - 46.0 %   MCV 90.6 78.0 - 100.0 fL   MCH 31.6 26.0 - 34.0 pg   MCHC 34.8 30.0 - 36.0 g/dL   RDW 12.5 11.5 - 15.5 %   Platelets 194 150 - 400 K/uL    Lipid Panel No results for input(s): CHOL, TRIG, HDL, CHOLHDL, VLDL, LDLCALC in the last 72 hours.  Studies/Results:   Medications:  Scheduled Meds: . atorvastatin  10 mg Oral QHS  . divalproex  1,000 mg Oral QHS  . enoxaparin (LOVENOX) injection  40 mg Subcutaneous Q24H  . fluticasone  2 spray Each Nare BID  . levothyroxine  25 mcg Oral QAC breakfast  . loratadine  10 mg Oral Daily  . methylPREDNISolone (SOLU-MEDROL) injection  1,000 mg Intravenous Q24H  . OLANZapine zydis  20 mg Oral QHS  . perphenazine  8 mg Oral BID  . topiramate  50 mg Oral TID   Continuous Infusions: . 0.9 % NaCl with KCl 20 mEq / L 125 mL/hr at 04/14/16 0230   PRN Meds:.haloperidol lactate     LOS: 1 day   Shatonia Hoots A. Merlene Floyd, M.D.  Diplomate, Tax adviser of Psychiatry and Neurology ( Neurology).  '

## 2016-04-14 NOTE — Clinical Social Work Note (Signed)
Clinical Social Work Assessment  Patient Details  Name: Denise Floyd MRN: [id number] Date of Birth: 21-May-1981  Date of referral:  04/14/16               Reason for consult:  Discharge Planning                Permission sought to share information with:  Guardian, Facility Sport and exercise psychologist Permission granted to share information::  Yes, Verbal Permission Granted  Name::        Agency::  Faithful Companion  Relationship::  mother  Contact Information:     Housing/Transportation Living arrangements for the past 2 months:  [place] of Information:  Patient, Facility Patient Interpreter Needed:  None Criminal Activity/Legal Involvement Pertinent to Current Situation/Hospitalization:  No - Comment as needed Significant Relationships:  Parents Lives with:  Facility Resident Do you feel safe going back to the place where you live?  Yes Need for family participation in patient care:  Yes (Comment)  Care giving concerns:  None reported. Pt is resident at group home.    Social Worker assessment / plan:  CSW met with pt at bedside. Pt alert and oriented, but was unable to recall name of facility she is from. Pt repeated statements multiple times. She states she likes it there and plans to return. Pt said her mother is her legal guardian. CSW left voicemail for mother requesting return call. Per Danae Chen at [facility], pt has been a resident there for about a year. Facility is now called [facility], but was [facility]. No home health prior to admission and okay to return. Pt requires assist with bathing. She came to ED due to weakness. Admitted due to MS exacerbation.   Employment status:  Disabled (Comment on whether or not currently receiving Disability) Insurance information:  Medicare PT Recommendations:  Not assessed at this time Information / Referral to community resources:  Other (Comment Required) (Return to [facility])  Patient/Family's  Response to care:  Pt plans to return to [facility] when medically stable.   Patient/Family's Understanding of and Emotional Response to Diagnosis, Current Treatment, and Prognosis:  Pt shared that her mother was not aware she was in the hospital. CSW left voicemail to discuss d/c planning.   Emotional Assessment Appearance:  Appears stated age Attitude/Demeanor/Rapport:  Other (Cooperative) Affect (typically observed):  Appropriate Orientation:  Oriented to Self, Oriented to Place, Oriented to Situation, Oriented to  Time Alcohol / Substance use:  Not Applicable Psych involvement (Current and /or in the community):  No (Comment)  Discharge Needs  Concerns to be addressed:  Discharge Planning Concerns Readmission within the last 30 days:  No Current discharge risk:  None Barriers to Discharge:  Continued Medical Work up   [employer], [place] 04/14/2016, 10:39 AM [phone]

## 2016-04-14 NOTE — Progress Notes (Signed)
PROGRESS NOTE    Denise Floyd  OIT:[id number] DOB: 1980-09-07 DOA: 04/13/2016 PCP: No primary care provider on file.     Brief Narrative:  35 year old woman admitted from a group home on 10/3. She has a history of multiple sclerosis. Had been complaining of increased weakness and double vision. Seen by neurology and is being treated with high-dose IV steroids for a presumed MS flare.   Assessment & Plan:   Principal Problem:   Multiple sclerosis exacerbation (HCC) Active Problems:   Hyperlipidemia   Schizophrenia (HCC)   Bipolar 1 disorder (HCC)   Seasonal allergies   Hypothyroidism   Multiple sclerosis exacerbation -Seen by neurology with plans for 5 days of IV steroids, high dose.  Schizophrenia -Continue perphenazine and Zyprexa as well as when necessary Haldol. -Her mood is currently stable, but has a history of steroid-induced psychosis and will need to be monitored closely.  Hypothyroidism -Continue Synthroid   DVT prophylaxis: Lovenox Code Status: Full code Family Communication: Patient only Disposition Plan: Resume back to group home once IV steroids completed  Consultants:   Neurology  Procedures:   None  Antimicrobials:   None    Subjective: Lying in bed, feels weak, appears confused  Objective: Vitals:   04/13/16 1700 04/13/16 1740 04/13/16 2038 04/14/16 0522  BP: 117/79 125/89 131/86 130/70  Pulse: 82 81 90 82  Resp: 16 18 16 18   Temp: 98 F (36.7 C) 97.7 F (36.5 C) 98 F (36.7 C) 98.2 F (36.8 C)  TempSrc:  Oral Oral Oral  SpO2: 99% 99% 100% 100%  Weight:  93.1 kg (205 lb 4 oz)    Height:  5\' 5"  (1.651 m)      Intake/Output Summary (Last 24 hours) at 04/14/16 1624 Last data filed at 04/14/16 1500  Gross per 24 hour  Intake          4485.42 ml  Output             1100 ml  Net          3385.42 ml   Filed Weights   04/13/16 1326 04/13/16 1740  Weight: 83.9 kg (185 lb) 93.1 kg (205 lb 4 oz)    Examination:  General  exam: Oriented to person and place, not to time, appears confused. Respiratory system: Clear to auscultation. Respiratory effort normal. Cardiovascular system:RRR. No murmurs, rubs, gallops. Gastrointestinal system: Abdomen is nondistended, soft and nontender. No organomegaly or masses felt. Normal bowel sounds heard. 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 Extremities: No C/C/E, +pedal pulses Skin: No rashes, lesions or ulcers     Data Reviewed: I have personally reviewed following labs and imaging studies  CBC:  Recent Labs Lab 04/13/16 1436 04/14/16 0739  WBC 6.3 6.8  NEUTROABS 4.0  --   HGB 14.6 15.5*  HCT 42.4 44.5  MCV 91.0 90.6  PLT 205 194   Basic Metabolic Panel:  Recent Labs Lab 04/13/16 1400 04/13/16 1436 04/14/16 0739  NA  --  137 138  K  --  3.6 4.0  CL  --  107 110  CO2  --  20* 20*  GLUCOSE  --  102* 165*  BUN  --  10 8  CREATININE  --  0.82 0.80  CALCIUM  --  9.1 9.0  MG 2.1  --   --   PHOS 4.2  --   --    GFR: Estimated Creatinine Clearance: 111.7 mL/min (by C-G formula based on SCr of 0.8  mg/dL). Liver Function Tests:  Recent Labs Lab 04/13/16 1436  AST 26  ALT 27  ALKPHOS 111  BILITOT 0.5  PROT 7.6  ALBUMIN 4.2   No results for input(s): LIPASE, AMYLASE in the last 168 hours. No results for input(s): AMMONIA in the last 168 hours. Coagulation Profile: No results for input(s): INR, PROTIME in the last 168 hours. Cardiac Enzymes:  Recent Labs Lab 04/13/16 1400  CKTOTAL 89   BNP (last 3 results) No results for input(s): PROBNP in the last 8760 hours. HbA1C: No results for input(s): HGBA1C in the last 72 hours. CBG: No results for input(s): GLUCAP in the last 168 hours. Lipid Profile: No results for input(s): CHOL, HDL, LDLCALC, TRIG, CHOLHDL, LDLDIRECT in the last 72 hours. Thyroid Function Tests:  Recent Labs  04/13/16 1436  TSH 2.267   Anemia Panel:  Recent Labs  04/14/16 0946    VITAMINB12 531   Urine analysis:    Component Value Date/Time   COLORURINE YELLOW 04/13/2016 1400   APPEARANCEUR CLEAR 04/13/2016 1400   LABSPEC 1.010 04/13/2016 1400   PHURINE 6.0 04/13/2016 1400   GLUCOSEU NEGATIVE 04/13/2016 1400   HGBUR NEGATIVE 04/13/2016 1400   BILIRUBINUR NEGATIVE 04/13/2016 1400   KETONESUR 15 (A) 04/13/2016 1400   PROTEINUR NEGATIVE 04/13/2016 1400   NITRITE NEGATIVE 04/13/2016 1400   LEUKOCYTESUR NEGATIVE 04/13/2016 1400   Sepsis Labs: @LABRCNTIP (procalcitonin:4,lacticidven:4)  )No results found for this or any previous visit (from the past 240 hour(s)).       Radiology Studies: Mr Laqueta JeanBrain W Or Wo Contrast  Result Date: 04/13/2016 CLINICAL DATA:  Weakness over the past week with difficulty ambulating. History of multiple sclerosis. EXAM: MRI HEAD WITHOUT AND WITH CONTRAST TECHNIQUE: Multiplanar, multiecho pulse sequences of the brain and surrounding structures were obtained without and with intravenous contrast. CONTRAST:  15mL MULTIHANCE GADOBENATE DIMEGLUMINE 529 MG/ML IV SOLN COMPARISON:  None. FINDINGS: The study is mildly motion degraded. Brain: There is no evidence of acute infarct, intracranial hemorrhage, mass, midline shift, or extra-axial fluid collection. The ventricles are slightly prominent in size for age and may reflect mild cerebral volume loss. There are patchy T2 hyperintensities in the cerebral white matter bilaterally which are more confluent in the periventricular regions. Multiple lesions are oriented perpendicularly to the lateral ventricles. There are juxtacortical lesions. There are lesions in the left middle cerebellar peduncle and brainstem, including a prominent 1.2 cm lesion in the midbrain. A left frontal lobe lesion demonstrates T2 shine through on diffusion imaging. No enhancing lesions are identified. Vascular: Major intracranial vascular flow voids are preserved. Skull and upper cervical spine: Unremarkable bone marrow signal.  Sinuses/Orbits: Orbits are unremarkable on this nondedicated study. Minimal paranasal sinus mucosal thickening. Clear mastoid air cells. Other: None. IMPRESSION: Moderately extensive white matter lesions consistent with provided history of multiple sclerosis. No evidence of active demyelination. Electronically Signed   By: Sebastian AcheAllen  Grady M.D.   On: 04/13/2016 17:14        Scheduled Meds: . atorvastatin  10 mg Oral QHS  . divalproex  1,000 mg Oral QHS  . enoxaparin (LOVENOX) injection  40 mg Subcutaneous Q24H  . fluticasone  2 spray Each Nare BID  . levothyroxine  25 mcg Oral QAC breakfast  . loratadine  10 mg Oral Daily  . methylPREDNISolone (SOLU-MEDROL) injection  1,000 mg Intravenous Q24H  . OLANZapine zydis  20 mg Oral QHS  . perphenazine  8 mg Oral BID  . topiramate  50 mg Oral TID  Continuous Infusions: . 0.9 % NaCl with KCl 20 mEq / L 125 mL/hr at 04/14/16 0939     LOS: 1 day    Time spent: 25 minutes. Greater than 50% of this time was spent in direct contact with the patient coordinating care.     Chaya Jan, MD [facility] Pager [phone]  If 7PM-7AM, please contact night-coverage [email or web address] Password [email or web address] 04/14/2016, 4:24 PM

## 2016-04-14 NOTE — Care Management Note (Signed)
Case Management Note  Patient Details  Name: Denise Floyd MRN: [id number] Date of Birth: 1981-05-12  Subjective/Objective:                  Pt admitted with MS flare. She is from [facility]. She is ind with ADL's. She is not active with any HH serivces PTA. She plans to return to [facility] at [place]. CSW is aware of living situation and will make arrangements for return to facility at [place].   Action/Plan: No CM needs anticipated. Will cont to follow.   Expected Discharge Date:  04/15/16               Expected Discharge Plan:  Group Home  In-House Referral:  Clinical Social Work  Discharge planning Services  CM Consult  Post Acute Care Choice:    Choice offered to:     DME Arranged:    DME Agency:     HH Arranged:    HH Agency:     Status of Service:  In process, will continue to follow  If discussed at Long Length of Stay Meetings, dates discussed:    Additional Comments:  Malcolm Metro, RN 04/14/2016, 2:21 PM

## 2016-04-14 NOTE — Care Management Important Message (Signed)
Important Message  Patient Details  Name: Denise Floyd MRN: [id number] Date of Birth: 1981/05/03   Medicare Important Message Given:  Yes    Malcolm Metro, RN 04/14/2016, 2:20 PM

## 2016-04-15 LAB — RPR: RPR: NONREACTIVE

## 2016-04-15 LAB — HIV ANTIBODY (ROUTINE TESTING W REFLEX): HIV Screen 4th Generation wRfx: NONREACTIVE

## 2016-04-15 LAB — GLUCOSE, CAPILLARY: GLUCOSE-CAPILLARY: 187 mg/dL — AB (ref 65–99)

## 2016-04-15 LAB — HOMOCYSTEINE: Homocysteine: 3.4 umol/L (ref 0.0–15.0)

## 2016-04-15 NOTE — Progress Notes (Signed)
Patient difficult to arouse. Vital signs stable, CBG 187. Able to arouse patient after sternal rub and repeating her name. Dr. Ardyth Harps notified.

## 2016-04-15 NOTE — Progress Notes (Signed)
PROGRESS NOTE    Denise Floyd  ZOX:[id number] DOB: September 09, 1980 DOA: 04/13/2016 PCP: No primary care provider on file.     Brief Narrative:  35 year old woman admitted from a group home on 10/3. She has a history of multiple sclerosis. Had been complaining of increased weakness and double vision. Seen by neurology and is being treated with high-dose IV steroids for a presumed MS flare.   Assessment & Plan:   Principal Problem:   Multiple sclerosis exacerbation (HCC) Active Problems:   Hyperlipidemia   Schizophrenia (HCC)   Bipolar 1 disorder (HCC)   Seasonal allergies   Hypothyroidism   Multiple sclerosis exacerbation -Seen by neurology with plans for 5 days of IV steroids, high dose.  Schizophrenia -Continue perphenazine and Zyprexa as well as when necessary Haldol. -Her mood is currently stable, but has a history of steroid-induced psychosis and will need to be monitored closely. -Per RN was a bit lethargic this am. When I went to assess her, she was awake, eating breakfast and able to answer simple questions appropriately, altho somewhat sleepy.  Hypothyroidism -Continue Synthroid   DVT prophylaxis: Lovenox Code Status: Full code Family Communication: Patient only Disposition Plan: Resume back to group home once IV steroids completed  Consultants:   Neurology  Procedures:   None  Antimicrobials:   None    Subjective: Lying in bed, feels weak, appears sleepy  Objective: Vitals:   04/13/16 2038 04/14/16 0522 04/14/16 2258 04/15/16 0636  BP: 131/86 130/70 124/83 (!) 106/59  Pulse: 90 82 81 93  Resp: 16 18 18 15   Temp: 98 F (36.7 C) 98.2 F (36.8 C) 98.3 F (36.8 C) 98.6 F (37 C)  TempSrc: Oral Oral Oral Oral  SpO2: 100% 100% 100% 99%  Weight:      Height:        Intake/Output Summary (Last 24 hours) at 04/15/16 1345 Last data filed at 04/15/16 1324  Gross per 24 hour  Intake           2122.5 ml  Output                2 ml  Net            2120.5 ml   Filed Weights   04/13/16 1326 04/13/16 1740  Weight: 83.9 kg (185 lb) 93.1 kg (205 lb 4 oz)    Examination:  General exam: Oriented to person and place, not to time, appears confused. Respiratory system: Clear to auscultation. Respiratory effort normal. Cardiovascular system:RRR. No murmurs, rubs, gallops. Gastrointestinal system: Abdomen is nondistended, soft and nontender. No organomegaly or masses felt. Normal bowel sounds heard. 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 Extremities: No C/C/E, +pedal pulses Skin: No rashes, lesions or ulcers     Data Reviewed: I have personally reviewed following labs and imaging studies  CBC:  Recent Labs Lab 04/13/16 1436 04/14/16 0739  WBC 6.3 6.8  NEUTROABS 4.0  --   HGB 14.6 15.5*  HCT 42.4 44.5  MCV 91.0 90.6  PLT 205 194   Basic Metabolic Panel:  Recent Labs Lab 04/13/16 1400 04/13/16 1436 04/14/16 0739  NA  --  137 138  K  --  3.6 4.0  CL  --  107 110  CO2  --  20* 20*  GLUCOSE  --  102* 165*  BUN  --  10 8  CREATININE  --  0.82 0.80  CALCIUM  --  9.1 9.0  MG 2.1  --   --  PHOS 4.2  --   --    GFR: Estimated Creatinine Clearance: 111.7 mL/min (by C-G formula based on SCr of 0.8 mg/dL). Liver Function Tests:  Recent Labs Lab 04/13/16 1436  AST 26  ALT 27  ALKPHOS 111  BILITOT 0.5  PROT 7.6  ALBUMIN 4.2   No results for input(s): LIPASE, AMYLASE in the last 168 hours. No results for input(s): AMMONIA in the last 168 hours. Coagulation Profile: No results for input(s): INR, PROTIME in the last 168 hours. Cardiac Enzymes:  Recent Labs Lab 04/13/16 1400  CKTOTAL 89   BNP (last 3 results) No results for input(s): PROBNP in the last 8760 hours. HbA1C: No results for input(s): HGBA1C in the last 72 hours. CBG:  Recent Labs Lab 04/15/16 0832  GLUCAP 187*   Lipid Profile: No results for input(s): CHOL, HDL, LDLCALC, TRIG, CHOLHDL, LDLDIRECT in  the last 72 hours. Thyroid Function Tests:  Recent Labs  04/13/16 1436  TSH 2.267   Anemia Panel:  Recent Labs  04/14/16 0946  VITAMINB12 531   Urine analysis:    Component Value Date/Time   COLORURINE YELLOW 04/13/2016 1400   APPEARANCEUR CLEAR 04/13/2016 1400   LABSPEC 1.010 04/13/2016 1400   PHURINE 6.0 04/13/2016 1400   GLUCOSEU NEGATIVE 04/13/2016 1400   HGBUR NEGATIVE 04/13/2016 1400   BILIRUBINUR NEGATIVE 04/13/2016 1400   KETONESUR 15 (A) 04/13/2016 1400   PROTEINUR NEGATIVE 04/13/2016 1400   NITRITE NEGATIVE 04/13/2016 1400   LEUKOCYTESUR NEGATIVE 04/13/2016 1400   Sepsis Labs: @LABRCNTIP (procalcitonin:4,lacticidven:4)  )No results found for this or any previous visit (from the past 240 hour(s)).       Radiology Studies: Mr Laqueta JeanBrain W Or Wo Contrast  Result Date: 04/13/2016 CLINICAL DATA:  Weakness over the past week with difficulty ambulating. History of multiple sclerosis. EXAM: MRI HEAD WITHOUT AND WITH CONTRAST TECHNIQUE: Multiplanar, multiecho pulse sequences of the brain and surrounding structures were obtained without and with intravenous contrast. CONTRAST:  15mL MULTIHANCE GADOBENATE DIMEGLUMINE 529 MG/ML IV SOLN COMPARISON:  None. FINDINGS: The study is mildly motion degraded. Brain: There is no evidence of acute infarct, intracranial hemorrhage, mass, midline shift, or extra-axial fluid collection. The ventricles are slightly prominent in size for age and may reflect mild cerebral volume loss. There are patchy T2 hyperintensities in the cerebral white matter bilaterally which are more confluent in the periventricular regions. Multiple lesions are oriented perpendicularly to the lateral ventricles. There are juxtacortical lesions. There are lesions in the left middle cerebellar peduncle and brainstem, including a prominent 1.2 cm lesion in the midbrain. A left frontal lobe lesion demonstrates T2 shine through on diffusion imaging. No enhancing lesions are  identified. Vascular: Major intracranial vascular flow voids are preserved. Skull and upper cervical spine: Unremarkable bone marrow signal. Sinuses/Orbits: Orbits are unremarkable on this nondedicated study. Minimal paranasal sinus mucosal thickening. Clear mastoid air cells. Other: None. IMPRESSION: Moderately extensive white matter lesions consistent with provided history of multiple sclerosis. No evidence of active demyelination. Electronically Signed   By: Sebastian AcheAllen  Grady M.D.   On: 04/13/2016 17:14        Scheduled Meds: . atorvastatin  10 mg Oral QHS  . divalproex  1,000 mg Oral QHS  . enoxaparin (LOVENOX) injection  40 mg Subcutaneous Q24H  . fluticasone  2 spray Each Nare BID  . levothyroxine  25 mcg Oral QAC breakfast  . loratadine  10 mg Oral Daily  . methylPREDNISolone (SOLU-MEDROL) injection  1,000 mg Intravenous Q24H  . OLANZapine  zydis  20 mg Oral QHS  . perphenazine  8 mg Oral BID  . topiramate  50 mg Oral TID   Continuous Infusions: . 0.9 % NaCl with KCl 20 mEq / L 125 mL/hr at 04/15/16 0815     LOS: 2 days    Time spent: 25 minutes. Greater than 50% of this time was spent in direct contact with the patient coordinating care.     Chaya Jan, MD [facility] Pager [phone]  If 7PM-7AM, please contact night-coverage [email or web address] Password [email or web address] 04/15/2016, 1:45 PM

## 2016-04-15 NOTE — Progress Notes (Signed)
[place] A. [name], MD     [email or web address]          Yulia Ulrich is an 35 y.o. female.   Assessment/Plan: 1. Multiple sclerosis with acute relapse presented with ophthalmoplegia, dysarthria and gait ataxia. MRI unfortunately shows severe disease burden thereby increasing the risk of long-term disability and severe neurological function. The patient will be given high-dose steroids Solu-Medrol 1 g. This would be done for 5 days given the severe burden seen on MRI. We will try to get records from her prior neurologist in [place]. Ultimately, she needs to be on disease modifying treatment. Given the heavy disease burden, I prefer that she be placed on Tysabri. This will be decided in outpatient setting however. We will attempt to obtain notes from her previous neurologist in [place].  2. Significant psychiatric problems including bipolar disorder with mania and the schizophrenia. Unfortunate, high-dose steroids have made these worse over the years especially bolus steroids given during relapses. We will therefore have to watch patient closely while she is getting high-dose steroids. We may have to give the patient as needed doses of Haldol or other neuroleptics help with mania or other symptoms that she will likely develop as a result of high-dose steroids.  3. Hypothyroidism.                She reports that her symptoms are same. She still has diplopia.    GENERAL: Drowsy.  HEENT: Normal  ABDOMEN: soft  EXTREMITIES: No edema   BACK: Normal  SKIN: Normal by inspection.    MENTAL STATUS: She is quite drowsy this morning. She reports that she's overall same. She tells that she slept okay although she is drowsy this morning.  CRANIAL NERVES: Right pupil is 5 mm and briskly reactive. Left is 4 mm and reactive. She has a mild ptosis on the left. There is a marked right exotropia in neutral position. She has significant eye  movement abnormalities with no upgaze. Downgaze is good. There is no movements of both eyes past midline to the left. She can cross midline with passive eye movements to about 80%. There is significant gaze evoked nystagmus. There is no flattening of the nasolabial folds; tongue is midline; uvula is midline; shoulder elevation is normal.  MOTOR: Normal tone, bulk and strength in the upper extremities; no pronator drift. Right hip flexion is 4/5. Dorsiflexion 5. Left lower extremity is 5/5 both dorsiflexion and plantar flexion.  COORDINATION: Left finger to nose is normal, right finger to nose is normal, No rest tremor; no intention tremor; no postural tremor; no bradykinesia.  REFLEXES: Deep tendon reflexes are symmetrical and normal. Babinski reflexes are flexor bilaterally.   SENSATION: Normal to light touch.  GAIT: This is slightly unsteady.      Objective: Vital signs in last 24 hours: Temp:  [98.3 F (36.8 C)-98.6 F (37 C)] 98.6 F (37 C) (10/05 0636) Pulse Rate:  [81-93] 93 (10/05 0636) Resp:  [15-18] 15 (10/05 0636) BP: (106-124)/(59-83) 106/59 (10/05 0636) SpO2:  [99 %-100 %] 99 % (10/05 0636)  Intake/Output from previous day: 10/04 0701 - 10/05 0700 In: 2842.5 [P.O.:1280; I.V.:1562.5] Out: 402 [Urine:402] Intake/Output this shift: No intake/output data recorded. Nutritional status: Diet heart healthy/carb modified Room service appropriate? Yes; Fluid consistency: Thin   Lab Results: Results for orders placed or performed during the hospital encounter of 04/13/16 (from the past 48 hour(s))  Urinalysis, Routine w reflex microscopic (not at [facility])     Status: Abnormal  Collection Time: 04/13/16  2:00 PM  Result Value Ref Range   Color, Urine YELLOW YELLOW   APPearance CLEAR CLEAR   Specific Gravity, Urine 1.010 1.005 - 1.030   pH 6.0 5.0 - 8.0   Glucose, UA NEGATIVE NEGATIVE mg/dL   Hgb urine dipstick NEGATIVE NEGATIVE   Bilirubin Urine NEGATIVE NEGATIVE     Ketones, ur 15 (A) NEGATIVE mg/dL   Protein, ur NEGATIVE NEGATIVE mg/dL   Nitrite NEGATIVE NEGATIVE   Leukocytes, UA NEGATIVE NEGATIVE    Comment: MICROSCOPIC NOT DONE ON URINES WITH NEGATIVE PROTEIN, BLOOD, LEUKOCYTES, NITRITE, OR GLUCOSE <1000 mg/dL.  Magnesium     Status: None   Collection Time: 04/13/16  2:00 PM  Result Value Ref Range   Magnesium 2.1 1.7 - 2.4 mg/dL  Phosphorus     Status: None   Collection Time: 04/13/16  2:00 PM  Result Value Ref Range   Phosphorus 4.2 2.5 - 4.6 mg/dL  Valproic acid level     Status: Abnormal   Collection Time: 04/13/16  2:00 PM  Result Value Ref Range   Valproic Acid Lvl 106 (H) 50.0 - 100.0 ug/mL  CK     Status: None   Collection Time: 04/13/16  2:00 PM  Result Value Ref Range   Total CK 89 38 - 234 U/L  CBC with Differential/Platelet     Status: None   Collection Time: 04/13/16  2:36 PM  Result Value Ref Range   WBC 6.3 4.0 - 10.5 K/uL   RBC 4.66 3.87 - 5.11 MIL/uL   Hemoglobin 14.6 12.0 - 15.0 g/dL   HCT 42.4 36.0 - 46.0 %   MCV 91.0 78.0 - 100.0 fL   MCH 31.3 26.0 - 34.0 pg   MCHC 34.4 30.0 - 36.0 g/dL   RDW 12.5 11.5 - 15.5 %   Platelets 205 150 - 400 K/uL   Neutrophils Relative % 64 %   Neutro Abs 4.0 1.7 - 7.7 K/uL   Lymphocytes Relative 28 %   Lymphs Abs 1.8 0.7 - 4.0 K/uL   Monocytes Relative 7 %   Monocytes Absolute 0.4 0.1 - 1.0 K/uL   Eosinophils Relative 1 %   Eosinophils Absolute 0.1 0.0 - 0.7 K/uL   Basophils Relative 0 %   Basophils Absolute 0.0 0.0 - 0.1 K/uL  Comprehensive metabolic panel     Status: Abnormal   Collection Time: 04/13/16  2:36 PM  Result Value Ref Range   Sodium 137 135 - 145 mmol/L   Potassium 3.6 3.5 - 5.1 mmol/L   Chloride 107 101 - 111 mmol/L   CO2 20 (L) 22 - 32 mmol/L   Glucose, Bld 102 (H) 65 - 99 mg/dL   BUN 10 6 - 20 mg/dL   Creatinine, Ser 0.82 0.44 - 1.00 mg/dL   Calcium 9.1 8.9 - 10.3 mg/dL   Total Protein 7.6 6.5 - 8.1 g/dL   Albumin 4.2 3.5 - 5.0 g/dL   AST 26 15 - 41  U/L   ALT 27 14 - 54 U/L   Alkaline Phosphatase 111 38 - 126 U/L   Total Bilirubin 0.5 0.3 - 1.2 mg/dL   GFR calc non Af Amer >60 >60 mL/min   GFR calc Af Amer >60 >60 mL/min    Comment: (NOTE) The eGFR has been calculated using the CKD EPI equation. This calculation has not been validated in all clinical situations. eGFR's persistently <60 mL/min signify possible Chronic Kidney Disease.    Anion gap 10  5 - 15  TSH     Status: None   Collection Time: 04/13/16  2:36 PM  Result Value Ref Range   TSH 2.267 0.350 - 4.500 uIU/mL  Basic metabolic panel     Status: Abnormal   Collection Time: 04/14/16  7:39 AM  Result Value Ref Range   Sodium 138 135 - 145 mmol/L   Potassium 4.0 3.5 - 5.1 mmol/L   Chloride 110 101 - 111 mmol/L   CO2 20 (L) 22 - 32 mmol/L   Glucose, Bld 165 (H) 65 - 99 mg/dL   BUN 8 6 - 20 mg/dL   Creatinine, Ser 0.80 0.44 - 1.00 mg/dL   Calcium 9.0 8.9 - 10.3 mg/dL   GFR calc non Af Amer >60 >60 mL/min   GFR calc Af Amer >60 >60 mL/min    Comment: (NOTE) The eGFR has been calculated using the CKD EPI equation. This calculation has not been validated in all clinical situations. eGFR's persistently <60 mL/min signify possible Chronic Kidney Disease.    Anion gap 8 5 - 15  CBC     Status: Abnormal   Collection Time: 04/14/16  7:39 AM  Result Value Ref Range   WBC 6.8 4.0 - 10.5 K/uL   RBC 4.91 3.87 - 5.11 MIL/uL   Hemoglobin 15.5 (H) 12.0 - 15.0 g/dL   HCT 44.5 36.0 - 46.0 %   MCV 90.6 78.0 - 100.0 fL   MCH 31.6 26.0 - 34.0 pg   MCHC 34.8 30.0 - 36.0 g/dL   RDW 12.5 11.5 - 15.5 %   Platelets 194 150 - 400 K/uL  RPR     Status: None   Collection Time: 04/14/16  9:46 AM  Result Value Ref Range   RPR Ser Ql Non Reactive Non Reactive    Comment: (NOTE) Performed At: [facility] [address] [place], [place] [id number] Lindon Romp MD YQ:[id number]   Vitamin B12     Status: None   Collection Time: 04/14/16  9:46 AM  Result Value Ref Range     Vitamin B-12 531 180 - 914 pg/mL    Comment: (NOTE) This assay is not validated for testing neonatal or myeloproliferative syndrome specimens for Vitamin B12 levels. Performed at [facility]   C-reactive protein     Status: None   Collection Time: 04/14/16  9:46 AM  Result Value Ref Range   CRP <0.8 <1.0 mg/dL    Comment: Performed at [facility]  Sedimentation rate     Status: None   Collection Time: 04/14/16  9:46 AM  Result Value Ref Range   Sed Rate 3 0 - 22 mm/hr  HIV antibody (routine testing) (NOT for [facility])     Status: None   Collection Time: 04/14/16  9:46 AM  Result Value Ref Range   HIV Screen 4th Generation wRfx Non Reactive Non Reactive    Comment: (NOTE) Performed At: [facility] [address] [place], [place] [id number] Lindon Romp MD RJ:[id number]     Lipid Panel No results for input(s): CHOL, TRIG, HDL, CHOLHDL, VLDL, LDLCALC in the last 72 hours.  Studies/Results:   Medications:  Scheduled Meds: . atorvastatin  10 mg Oral QHS  . divalproex  1,000 mg Oral QHS  . enoxaparin (LOVENOX) injection  40 mg Subcutaneous Q24H  . fluticasone  2 spray Each Nare BID  . levothyroxine  25 mcg Oral QAC breakfast  . loratadine  10 mg Oral Daily  .  methylPREDNISolone (SOLU-MEDROL) injection  1,000 mg Intravenous Q24H  . OLANZapine zydis  20 mg Oral QHS  . perphenazine  8 mg Oral BID  . topiramate  50 mg Oral TID   Continuous Infusions: . 0.9 % NaCl with KCl 20 mEq / L 125 mL/hr at 04/15/16 0132   PRN Meds:.haloperidol lactate     LOS: 2 days   Sandra Brents A. [name], M.D.  Diplomate, Tax adviser of Psychiatry and Neurology ( Neurology).  '

## 2016-04-16 LAB — ANTINUCLEAR ANTIBODIES, IFA: ANA Ab, IFA: NEGATIVE

## 2016-04-16 NOTE — Care Management (Addendum)
HH PT ordered by MD for patient to have when she returns to Group Home. Patient does not have a PCP listed and will need one for [facility] referral. Spoke with group home and they state patient is seen by a NP.  I have called patient's mother and Purvis Kilts (group home administator), no answer. I am doubtful that [facility] PT can be arranged before discharged back to group home. Group Home number [phone] Purvis Kilts [phone]

## 2016-04-16 NOTE — Clinical Social Work Note (Signed)
CSW attempted to reach pt's mother/guardian again today and left another voicemail. Pt will likely be ready for d/c on Sunday. [place] at facility aware and agreeable. Requests that they be called at [phone] when pt is ready.  Derenda Fennel, LCSW [phone]

## 2016-04-16 NOTE — Progress Notes (Signed)
[facility] Kaleeyah Cuffie A. Gerilyn Pilgrim, MD     [email or web address]          Denise Floyd is an 35 y.o. female.   Assessment/Plan: 1. Multiple sclerosis with acute relapse presented with ophthalmoplegia, dysarthria and gait ataxia. MRI unfortunately shows severe disease burden thereby increasing the risk of long-term disability and severe neurological function. The patient will be given high-dose steroids Solu-Medrol 1 g. This would be done for 5 days given the severe burden seen on MRI. We will try to get records from her prior neurologist in [place]. Ultimately, she needs to be on disease modifying treatment. Given the heavy disease burden, I prefer that she be placed on Tysabri. This will be decided in outpatient setting however. We will attempt to obtain notes from her previous neurologist in [place].  2. Significant psychiatric problems including bipolar disorder with mania and the schizophrenia. Unfortunate, high-dose steroids have made these worse over the years especially bolus steroids given during relapses. We will therefore have to watch patient closely while she is getting high-dose steroids. We may have to give the patient as needed doses of Haldol or other neuroleptics help with mania or other symptoms that she will likely develop as a result of high-dose steroids.  3. Hypothyroidism.       She still drowsy this E line but more arousable than yesterday. She reports no complaints.        GENERAL: Drowsy.  HEENT: Normal  ABDOMEN: soft  EXTREMITIES: No edema   BACK: Normal  SKIN: Normal by inspection.    MENTAL STATUS: She is sleeping but easily arousable. We did have a recent conversation. She appears to be more responsive with her speech and less dysarthric. There is much less stuttering and perseveration.   CRANIAL NERVES: Right pupil is 5 mm and briskly reactive. Left is 4 mm and reactive. She has a mild ptosis on the left. There is a  marked right exotropia in neutral position. She has significant eye movement abnormalities with no upgaze. Downgaze is good. There is no movements of both eyes past midline to the left. She can cross midline with passive eye movements to about 80%. There is significant gaze evoked nystagmus. There is no flattening of the nasolabial folds; tongue is midline; uvula is midline; shoulder elevation is normal.  MOTOR: Normal tone, bulk and strength in the upper extremities; no pronator drift. Right hip flexion is 4/5. Dorsiflexion 5. Left lower extremity is 5/5 both dorsiflexion and plantar flexion.  COORDINATION: Left finger to nose is normal, right finger to nose is normal, No rest tremor; no intention tremor; no postural tremor; no bradykinesia.  REFLEXES: Deep tendon reflexes are symmetrical and normal. Babinski reflexes are flexor bilaterally.   SENSATION: Normal to light touch.  GAIT: This is slightly unsteady.      Objective: Vital signs in last 24 hours: Temp:  [97.8 F (36.6 C)-98.3 F (36.8 C)] 97.8 F (36.6 C) (10/06 1403) Pulse Rate:  [66-91] 91 (10/06 1403) Resp:  [15-18] 18 (10/06 1403) BP: (119-124)/(76-84) 119/76 (10/06 1403) SpO2:  [100 %] 100 % (10/06 1403)  Intake/Output from previous day: 10/05 0701 - 10/06 0700 In: 2859.3 [P.O.:560; I.V.:2183.3; IV Piggyback:116] Out: -  Intake/Output this shift: No intake/output data recorded. Nutritional status: Diet heart healthy/carb modified Room service appropriate? Yes; Fluid consistency: Thin   Lab Results: Results for orders placed or performed during the hospital encounter of 04/13/16 (from the past 48 hour(s))  Glucose, capillary     Status:  Abnormal   Collection Time: 04/15/16  8:32 AM  Result Value Ref Range   Glucose-Capillary 187 (H) 65 - 99 mg/dL    Lipid Panel No results for input(s): CHOL, TRIG, HDL, CHOLHDL, VLDL, LDLCALC in the last 72 hours.  Studies/Results:   Medications:  Scheduled  Meds: . atorvastatin  10 mg Oral QHS  . divalproex  1,000 mg Oral QHS  . enoxaparin (LOVENOX) injection  40 mg Subcutaneous Q24H  . fluticasone  2 spray Each Nare BID  . levothyroxine  25 mcg Oral QAC breakfast  . loratadine  10 mg Oral Daily  . methylPREDNISolone (SOLU-MEDROL) injection  1,000 mg Intravenous Q24H  . OLANZapine zydis  20 mg Oral QHS  . perphenazine  8 mg Oral BID  . topiramate  50 mg Oral TID   Continuous Infusions: . 0.9 % NaCl with KCl 20 mEq / L 125 mL/hr at 04/16/16 1329   PRN Meds:.haloperidol lactate     LOS: 3 days   Yocelyn Brocious A. Gerilyn Pilgrim, M.D.  Diplomate, Biomedical engineer of Psychiatry and Neurology ( Neurology).  '

## 2016-04-16 NOTE — Care Management Important Message (Signed)
Important Message  Patient Details  Name: Denise NeasHelen Wiehe MRN: [id number] Date of Birth: 05/06/1981   Medicare Important Message Given:  Yes    Koi Zangara, Chrystine OilerSharley Diane, RN 04/16/2016, 1:35 PM

## 2016-04-16 NOTE — Progress Notes (Signed)
PROGRESS NOTE    Denise Floyd  POE:[id number] DOB: [birth year] DOA: 04/13/2016 PCP: No primary care provider on file.     Brief Narrative:  35 year old woman admitted from a group home on 10/3. She has a history of multiple sclerosis. Had been complaining of increased weakness and double vision. Seen by neurology and is being treated with high-dose IV steroids for a presumed MS flare.   Assessment & Plan:   Principal Problem:   Multiple sclerosis exacerbation (HCC) Active Problems:   Hyperlipidemia   Schizophrenia (HCC)   Bipolar 1 disorder (HCC)   Seasonal allergies   Hypothyroidism   Multiple sclerosis exacerbation -Seen by neurology with plans for 5 days of IV steroids, high dose. Has received 3/5 doses.  Schizophrenia -Continue perphenazine and Zyprexa as well as when necessary Haldol. -Her mood is currently stable, but has a history of steroid-induced psychosis and will need to be monitored closely.  Hypothyroidism -Continue Synthroid   DVT prophylaxis: Lovenox Code Status: Full code Family Communication: discussed with mother and stepfather. All questions answered. Disposition Plan: Resume back to group home once IV steroids completed  Consultants:   Neurology  Procedures:   None  Antimicrobials:   None    Subjective: Lying in bed, feels weak, appears sleepy  Objective: Vitals:   04/15/16 1431 04/15/16 2308 04/16/16 0639 04/16/16 1403  BP: 123/81 124/81 121/84 119/76  Pulse: 86 66 87 91  Resp: 16 17 15 18   Temp: 97.5 F (36.4 C) 98.3 F (36.8 C) 98 F (36.7 C) 97.8 F (36.6 C)  TempSrc: Oral Oral Oral Oral  SpO2: 100% 100% 100% 100%  Weight:      Height:        Intake/Output Summary (Last 24 hours) at 04/16/16 1533 Last data filed at 04/16/16 1300  Gross per 24 hour  Intake              720 ml  Output              450 ml  Net              270 ml   Filed Weights   04/13/16 1326 04/13/16 1740  Weight: 83.9 kg (185 lb) 93.1 kg  (205 lb 4 oz)    Examination:  General exam: Oriented to person and place, not to time, appears confused. Respiratory system: Clear to auscultation. Respiratory effort normal. Cardiovascular system:RRR. No murmurs, rubs, gallops. Gastrointestinal system: Abdomen is nondistended, soft and nontender. No organomegaly or masses felt. Normal bowel sounds heard. 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 Extremities: No C/C/E, +pedal pulses Skin: No rashes, lesions or ulcers     Data Reviewed: I have personally reviewed following labs and imaging studies  CBC:  Recent Labs Lab 04/13/16 1436 04/14/16 0739  WBC 6.3 6.8  NEUTROABS 4.0  --   HGB 14.6 15.5*  HCT 42.4 44.5  MCV 91.0 90.6  PLT 205 194   Basic Metabolic Panel:  Recent Labs Lab 04/13/16 1400 04/13/16 1436 04/14/16 0739  NA  --  137 138  K  --  3.6 4.0  CL  --  107 110  CO2  --  20* 20*  GLUCOSE  --  102* 165*  BUN  --  10 8  CREATININE  --  0.82 0.80  CALCIUM  --  9.1 9.0  MG 2.1  --   --   PHOS 4.2  --   --    GFR: Estimated Creatinine  Clearance: 111.7 mL/min (by C-G formula based on SCr of 0.8 mg/dL). Liver Function Tests:  Recent Labs Lab 04/13/16 1436  AST 26  ALT 27  ALKPHOS 111  BILITOT 0.5  PROT 7.6  ALBUMIN 4.2   No results for input(s): LIPASE, AMYLASE in the last 168 hours. No results for input(s): AMMONIA in the last 168 hours. Coagulation Profile: No results for input(s): INR, PROTIME in the last 168 hours. Cardiac Enzymes:  Recent Labs Lab 04/13/16 1400  CKTOTAL 89   BNP (last 3 results) No results for input(s): PROBNP in the last 8760 hours. HbA1C: No results for input(s): HGBA1C in the last 72 hours. CBG:  Recent Labs Lab 04/15/16 0832  GLUCAP 187*   Lipid Profile: No results for input(s): CHOL, HDL, LDLCALC, TRIG, CHOLHDL, LDLDIRECT in the last 72 hours. Thyroid Function Tests: No results for input(s): TSH, T4TOTAL, FREET4, T3FREE,  THYROIDAB in the last 72 hours. Anemia Panel:  Recent Labs  04/14/16 0946  VITAMINB12 531   Urine analysis:    Component Value Date/Time   COLORURINE YELLOW 04/13/2016 1400   APPEARANCEUR CLEAR 04/13/2016 1400   LABSPEC 1.010 04/13/2016 1400   PHURINE 6.0 04/13/2016 1400   GLUCOSEU NEGATIVE 04/13/2016 1400   HGBUR NEGATIVE 04/13/2016 1400   BILIRUBINUR NEGATIVE 04/13/2016 1400   KETONESUR 15 (A) 04/13/2016 1400   PROTEINUR NEGATIVE 04/13/2016 1400   NITRITE NEGATIVE 04/13/2016 1400   LEUKOCYTESUR NEGATIVE 04/13/2016 1400   Sepsis Labs: @LABRCNTIP (procalcitonin:4,lacticidven:4)  )No results found for this or any previous visit (from the past 240 hour(s)).       Radiology Studies: No results found.      Scheduled Meds: . atorvastatin  10 mg Oral QHS  . divalproex  1,000 mg Oral QHS  . enoxaparin (LOVENOX) injection  40 mg Subcutaneous Q24H  . fluticasone  2 spray Each Nare BID  . levothyroxine  25 mcg Oral QAC breakfast  . loratadine  10 mg Oral Daily  . methylPREDNISolone (SOLU-MEDROL) injection  1,000 mg Intravenous Q24H  . OLANZapine zydis  20 mg Oral QHS  . perphenazine  8 mg Oral BID  . topiramate  50 mg Oral TID   Continuous Infusions: . 0.9 % NaCl with KCl 20 mEq / L 125 mL/hr at 04/16/16 1329     LOS: 3 days    Time spent: 25 minutes. Greater than 50% of this time was spent in direct contact with the patient coordinating care.     Chaya JanHERNANDEZ ACOSTA,Zaydn Gutridge, MD [facility] Pager [phone]  If 7PM-7AM, please contact night-coverage [email or web address] Password [email or web address] 04/16/2016, 3:33 PM

## 2016-04-17 MED ORDER — METHYLPREDNISOLONE SODIUM SUCC 1000 MG IJ SOLR
INTRAMUSCULAR | Status: AC
  Filled 2016-04-17: qty 8

## 2016-04-17 NOTE — Progress Notes (Signed)
PROGRESS NOTE    Denise Floyd  LEZ:[id number] DOB: [birth year] DOA: 04/13/2016 PCP: No primary care provider on file.     Brief Narrative:  35 year old woman admitted from a group home on 10/3. She has a history of multiple sclerosis. Had been complaining of increased weakness and double vision. Seen by neurology and is being treated with high-dose IV steroids for a presumed MS flare.   Assessment & Plan:   Principal Problem:   Multiple sclerosis exacerbation (HCC) Active Problems:   Hyperlipidemia   Schizophrenia (HCC)   Bipolar 1 disorder (HCC)   Seasonal allergies   Hypothyroidism   Multiple sclerosis exacerbation -Seen by neurology with plans for 5 days of IV steroids, high dose. Has received 4/5 doses.  Schizophrenia -Continue perphenazine and Zyprexa as well as when necessary Haldol. -Her mood is currently stable, but has a history of steroid-induced psychosis and will need to be monitored closely.  Hypothyroidism -Continue Synthroid   DVT prophylaxis: Lovenox Code Status: Full code Family Communication: discussed with mother and stepfather. All questions answered. Disposition Plan: Resume back to group home once IV steroids completed. Likely in 24 hours. This has been pre-arranged by weekday SW.  Consultants:   Neurology  Procedures:   None  Antimicrobials:   None    Subjective: Lying in bed, feels weak, appears sleepy  Objective: Vitals:   04/16/16 2101 04/17/16 0655 04/17/16 1236 04/17/16 1343  BP: 116/66 105/61  114/72  Pulse: 76 66  84  Resp: 20 20  20   Temp: 98.5 F (36.9 C) 97.5 F (36.4 C)  98.1 F (36.7 C)  TempSrc: Oral Oral  Oral  SpO2: 100% 98% 97% 99%  Weight:      Height:        Intake/Output Summary (Last 24 hours) at 04/17/16 1530 Last data filed at 04/17/16 1400  Gross per 24 hour  Intake             6231 ml  Output             1300 ml  Net             4931 ml   Filed Weights   04/13/16 1326 04/13/16 1740    Weight: 83.9 kg (185 lb) 93.1 kg (205 lb 4 oz)    Examination:  General exam: Oriented to person and place, not to time, appears confused. Respiratory system: Clear to auscultation. Respiratory effort normal. Cardiovascular system:RRR. No murmurs, rubs, gallops. Gastrointestinal system: Abdomen is nondistended, soft and nontender. No organomegaly or masses felt. Normal bowel sounds heard. 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 Extremities: No C/C/E, +pedal pulses Skin: No rashes, lesions or ulcers     Data Reviewed: I have personally reviewed following labs and imaging studies  CBC:  Recent Labs Lab 04/13/16 1436 04/14/16 0739  WBC 6.3 6.8  NEUTROABS 4.0  --   HGB 14.6 15.5*  HCT 42.4 44.5  MCV 91.0 90.6  PLT 205 194   Basic Metabolic Panel:  Recent Labs Lab 04/13/16 1400 04/13/16 1436 04/14/16 0739  NA  --  137 138  K  --  3.6 4.0  CL  --  107 110  CO2  --  20* 20*  GLUCOSE  --  102* 165*  BUN  --  10 8  CREATININE  --  0.82 0.80  CALCIUM  --  9.1 9.0  MG 2.1  --   --   PHOS 4.2  --   --  GFR: Estimated Creatinine Clearance: 111.7 mL/min (by C-G formula based on SCr of 0.8 mg/dL). Liver Function Tests:  Recent Labs Lab 04/13/16 1436  AST 26  ALT 27  ALKPHOS 111  BILITOT 0.5  PROT 7.6  ALBUMIN 4.2   No results for input(s): LIPASE, AMYLASE in the last 168 hours. No results for input(s): AMMONIA in the last 168 hours. Coagulation Profile: No results for input(s): INR, PROTIME in the last 168 hours. Cardiac Enzymes:  Recent Labs Lab 04/13/16 1400  CKTOTAL 89   BNP (last 3 results) No results for input(s): PROBNP in the last 8760 hours. HbA1C: No results for input(s): HGBA1C in the last 72 hours. CBG:  Recent Labs Lab 04/15/16 0832  GLUCAP 187*   Lipid Profile: No results for input(s): CHOL, HDL, LDLCALC, TRIG, CHOLHDL, LDLDIRECT in the last 72 hours. Thyroid Function Tests: No results for  input(s): TSH, T4TOTAL, FREET4, T3FREE, THYROIDAB in the last 72 hours. Anemia Panel: No results for input(s): VITAMINB12, FOLATE, FERRITIN, TIBC, IRON, RETICCTPCT in the last 72 hours. Urine analysis:    Component Value Date/Time   COLORURINE YELLOW 04/13/2016 1400   APPEARANCEUR CLEAR 04/13/2016 1400   LABSPEC 1.010 04/13/2016 1400   PHURINE 6.0 04/13/2016 1400   GLUCOSEU NEGATIVE 04/13/2016 1400   HGBUR NEGATIVE 04/13/2016 1400   BILIRUBINUR NEGATIVE 04/13/2016 1400   KETONESUR 15 (A) 04/13/2016 1400   PROTEINUR NEGATIVE 04/13/2016 1400   NITRITE NEGATIVE 04/13/2016 1400   LEUKOCYTESUR NEGATIVE 04/13/2016 1400   Sepsis Labs: @LABRCNTIP (procalcitonin:4,lacticidven:4)  )No results found for this or any previous visit (from the past 240 hour(s)).       Radiology Studies: No results found.      Scheduled Meds: . atorvastatin  10 mg Oral QHS  . divalproex  1,000 mg Oral QHS  . enoxaparin (LOVENOX) injection  40 mg Subcutaneous Q24H  . fluticasone  2 spray Each Nare BID  . levothyroxine  25 mcg Oral QAC breakfast  . loratadine  10 mg Oral Daily  . methylPREDNISolone (SOLU-MEDROL) injection  1,000 mg Intravenous Q24H  . OLANZapine zydis  20 mg Oral QHS  . perphenazine  8 mg Oral BID  . topiramate  50 mg Oral TID   Continuous Infusions: . 0.9 % NaCl with KCl 20 mEq / L 125 mL/hr at 04/17/16 1406     LOS: 4 days    Time spent: 25 minutes. Greater than 50% of this time was spent in direct contact with the patient coordinating care.     Chaya JanHERNANDEZ ACOSTA,ESTELA, MD [facility] Pager [phone]  If 7PM-7AM, please contact night-coverage [email or web address] Password [email or web address] 04/17/2016, 3:30 PM

## 2016-04-18 NOTE — Care Management Note (Signed)
Case Management Note  Patient Details  Name: Denise Floyd MRN: [id number] Date of Birth: 09-Jan-1981  Subjective/Objective:                    Action/Plan:  Patient to discharge to group home and receive home health physical therapy.     Expected Discharge Date:  04/18/16              Expected Discharge Plan:  Group Home  In-House Referral:  Clinical Social Work  Discharge planning Services  CM Consult  Post Acute Care Choice:  Home Health Choice offered to:  Patient  DME Arranged:    DME Agency:     HH Arranged:  PT HH Agency:  [facility]  Status of Service:  Completed, signed off  If discussed at Long Length of Stay Meetings, dates discussed:    Additional Comments:  Fuller Plan, RN 04/18/2016, 12:50 PM

## 2016-04-18 NOTE — Discharge Summary (Signed)
Physician Discharge Summary  Denise Floyd ZOX:[id number] DOB: [birth year] DOA: 04/13/2016  PCP: No primary care provider on file.  Admit date: 04/13/2016 Discharge date: 04/18/2016  Time spent: 45 minutes  Recommendations for Outpatient Follow-up:  -Will be discharged back to group home today. -Will need OP follow up with neurology.   Discharge Diagnoses:  Principal Problem:   Multiple sclerosis exacerbation (HCC) Active Problems:   Hyperlipidemia   Schizophrenia (HCC)   Bipolar 1 disorder (HCC)   Seasonal allergies   Hypothyroidism   Discharge Condition: Stable and improved  Filed Weights   04/13/16 1326 04/13/16 1740  Weight: 83.9 kg (185 lb) 93.1 kg (205 lb 4 oz)    History of present illness:  As per Dr. Robb Matar on 10/3: Denise Floyd is a 35 y.o. female with medical history significant of bipolar 1 disorder, schizophrenia, hypothyroidism, hyperlipidemia, seasonal allergies, Multiple sclerosis who is brought to the ER via [facility] from her group home due to generalized weakness for more than week.   History is difficult to obtain due to the patient's symptoms and speech repetition. Per Ms. Meyer Cory from the group home [phone]), since last week the patient has had progressively worse strabismus, generalized weakness, speech difficulty and repetition. No trauma, fever or any other symptoms noticed. She has had normal appetite and sleep.   ED Course: Work up in the ER showed a normal CBC, unremarkable CMP with mildly decreased CO2 level at 20 mmol/L and mild non- fasting hyperglycemia at 102 mg/dL. MRI of brain did not show any active demyelination, but shows extensive white matter lesions consistent with her MS history.   Hospital Course:   Multiple sclerosis exacerbation -Seen by neurology with plans for 5 days of IV steroids, high dose. Has received 4/5 doses. -Will need OP follow up for consideration of MS  treatment.  Schizophrenia -Continue perphenazine and Zyprexa as well as when necessary Haldol. -Her mood is currently stable.  Hypothyroidism -Continue Synthroid  Procedures:  None   Consultations:  Neurology  Discharge Instructions  Discharge Instructions    Diet - low sodium heart healthy    Complete by:  As directed    Increase activity slowly    Complete by:  As directed        Medication List    TAKE these medications   atorvastatin 10 MG tablet Commonly known as:  LIPITOR Take 10 mg by mouth at bedtime.   cetirizine 10 MG tablet Commonly known as:  ZYRTEC Take 10 mg by mouth daily.   divalproex 500 MG 24 hr tablet Commonly known as:  DEPAKOTE ER Take 1,000 mg by mouth at bedtime.   fluticasone 50 MCG/ACT nasal spray Commonly known as:  FLONASE Place 2 sprays into both nostrils 2 (two) times daily.   levothyroxine 25 MCG tablet Commonly known as:  SYNTHROID, LEVOTHROID Take 25 mcg by mouth daily.   OLANZapine zydis 20 MG disintegrating tablet Commonly known as:  ZYPREXA Take 20 mg by mouth at bedtime.   perphenazine 4 MG tablet Commonly known as:  TRILAFON Take 8 mg by mouth 2 (two) times daily.   topiramate 50 MG tablet Commonly known as:  TOPAMAX Take 50 mg by mouth 3 (three) times daily.      Allergies  Allergen Reactions  . Pineapple Nausea And Vomiting      The results of significant diagnostics from this hospitalization (including imaging, microbiology, ancillary and laboratory) are listed below for reference.    Significant Diagnostic Studies:  Mr Laqueta JeanBrain W Or Wo Contrast  Result Date: 04/13/2016 CLINICAL DATA:  Weakness over the past week with difficulty ambulating. History of multiple sclerosis. EXAM: MRI HEAD WITHOUT AND WITH CONTRAST TECHNIQUE: Multiplanar, multiecho pulse sequences of the brain and surrounding structures were obtained without and with intravenous contrast. CONTRAST:  15mL MULTIHANCE GADOBENATE DIMEGLUMINE 529  MG/ML IV SOLN COMPARISON:  None. FINDINGS: The study is mildly motion degraded. Brain: There is no evidence of acute infarct, intracranial hemorrhage, mass, midline shift, or extra-axial fluid collection. The ventricles are slightly prominent in size for age and may reflect mild cerebral volume loss. There are patchy T2 hyperintensities in the cerebral white matter bilaterally which are more confluent in the periventricular regions. Multiple lesions are oriented perpendicularly to the lateral ventricles. There are juxtacortical lesions. There are lesions in the left middle cerebellar peduncle and brainstem, including a prominent 1.2 cm lesion in the midbrain. A left frontal lobe lesion demonstrates T2 shine through on diffusion imaging. No enhancing lesions are identified. Vascular: Major intracranial vascular flow voids are preserved. Skull and upper cervical spine: Unremarkable bone marrow signal. Sinuses/Orbits: Orbits are unremarkable on this nondedicated study. Minimal paranasal sinus mucosal thickening. Clear mastoid air cells. Other: None. IMPRESSION: Moderately extensive white matter lesions consistent with provided history of multiple sclerosis. No evidence of active demyelination. Electronically Signed   By: Sebastian AcheAllen  Grady M.D.   On: 04/13/2016 17:14    Microbiology: No results found for this or any previous visit (from the past 240 hour(s)).   Labs: Basic Metabolic Panel:  Recent Labs Lab 04/13/16 1400 04/13/16 1436 04/14/16 0739  NA  --  137 138  K  --  3.6 4.0  CL  --  107 110  CO2  --  20* 20*  GLUCOSE  --  102* 165*  BUN  --  10 8  CREATININE  --  0.82 0.80  CALCIUM  --  9.1 9.0  MG 2.1  --   --   PHOS 4.2  --   --    Liver Function Tests:  Recent Labs Lab 04/13/16 1436  AST 26  ALT 27  ALKPHOS 111  BILITOT 0.5  PROT 7.6  ALBUMIN 4.2   No results for input(s): LIPASE, AMYLASE in the last 168 hours. No results for input(s): AMMONIA in the last 168  hours. CBC:  Recent Labs Lab 04/13/16 1436 04/14/16 0739  WBC 6.3 6.8  NEUTROABS 4.0  --   HGB 14.6 15.5*  HCT 42.4 44.5  MCV 91.0 90.6  PLT 205 194   Cardiac Enzymes:  Recent Labs Lab 04/13/16 1400  CKTOTAL 89   BNP: BNP (last 3 results) No results for input(s): BNP in the last 8760 hours.  ProBNP (last 3 results) No results for input(s): PROBNP in the last 8760 hours.  CBG:  Recent Labs Lab 04/15/16 0832  GLUCAP 187*       Signed:  Chaya JanHERNANDEZ ACOSTA,ESTELA  [facility] Pager: [phone] 04/18/2016, 12:08 PM

## 2017-06-06 IMAGING — MR MR HEAD WO/W CM
10 of 15 series · 27 of 48 positions shown · IV contrast (multihance)
Comparison: None.

CLINICAL DATA: Weakness over the past week with difficulty
ambulating. History of multiple sclerosis.

EXAM:
MRI HEAD WITHOUT AND WITH CONTRAST
TECHNIQUE: Multiplanar, multiecho pulse sequences of the brain and surrounding
structures were obtained without and with intravenous contrast.
CONTRAST:  15mL MULTIHANCE GADOBENATE DIMEGLUMINE 529 MG/ML IV SOLN

[Series 4: DWI · axial · 3.0mm · 0.68mm/px · z∈[-79,+80]mm · 3 of 55 slices shown (1 of 4)]
[im 1/55]
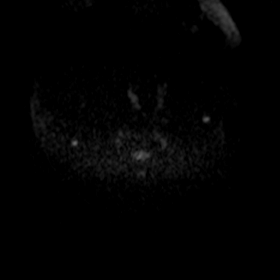
[im 28/55]
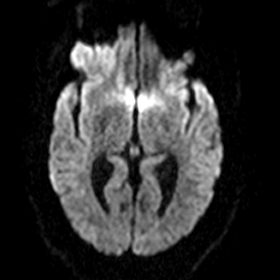
[im 55/55]
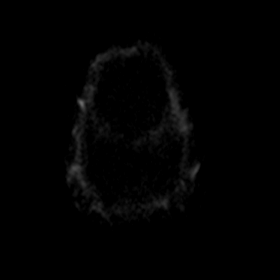

[Series 5: DWI · axial · 3.0mm · 0.73mm/px · z∈[-83,+76]mm · 3 of 55 slices shown (2 of 4)]
[im 1/55]
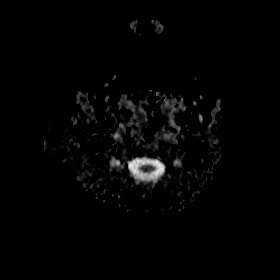
[im 28/55]
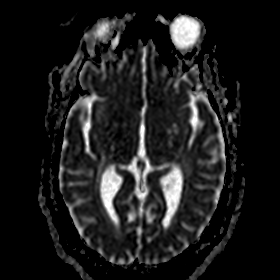
[im 55/55]
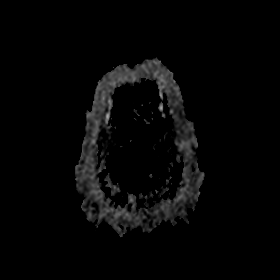

[Series 6: DWI · coronal · 5.0mm · 0.45mm/px · 3 of 38 slices shown (3 of 4)]
[im 1/38]
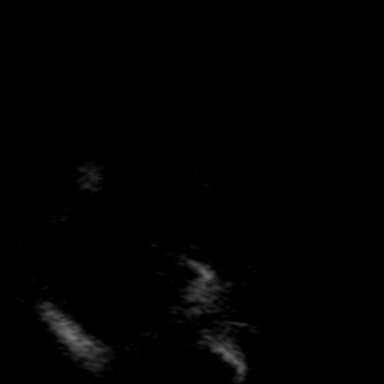
[im 19/38]
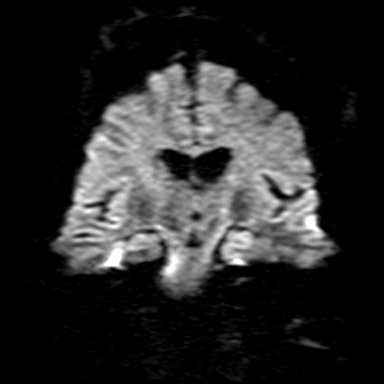
[im 38/38]
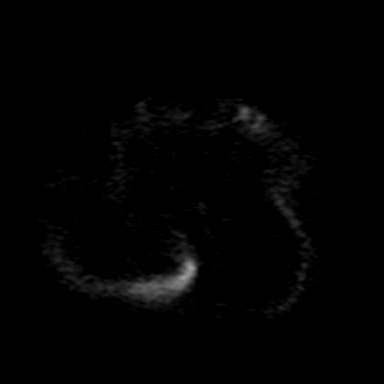

[Series 7: DWI · coronal · 5.0mm · 0.45mm/px · 3 of 38 slices shown (4 of 4)]
[im 1/38]
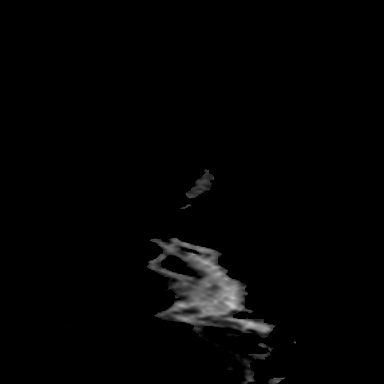
[im 19/38]
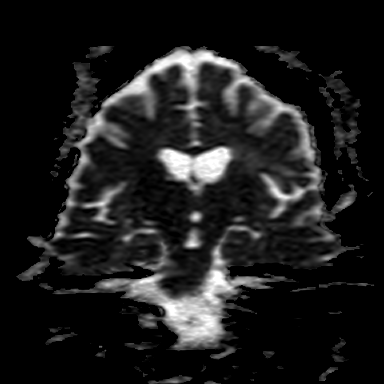
[im 38/38]
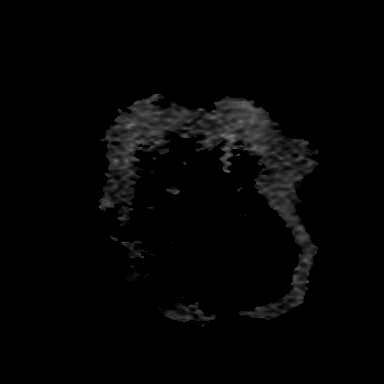

[Series 8: T2 · axial · 5.0mm · 0.47mm/px · z∈[-72,+68]mm · 2 of 23 slices shown (1 of 2)]
[im 1/23]
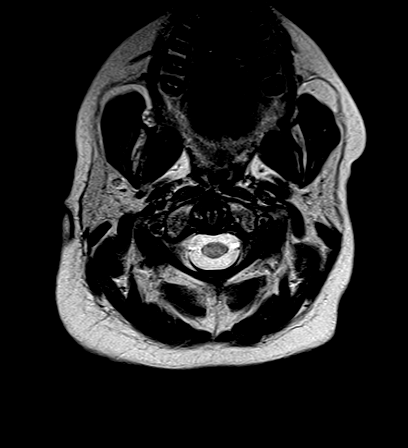
[im 23/23]
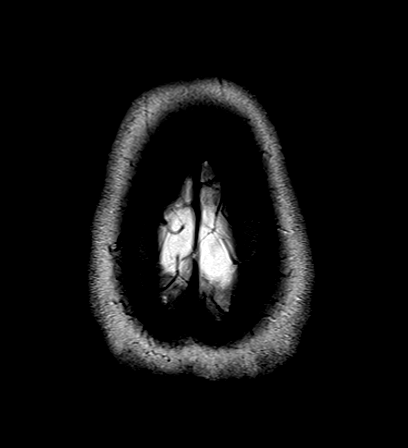

[Series 9: FLAIR · axial · 5.0mm · 0.33mm/px · z∈[-75,+65]mm · 2 of 23 slices shown]
[im 1/23]
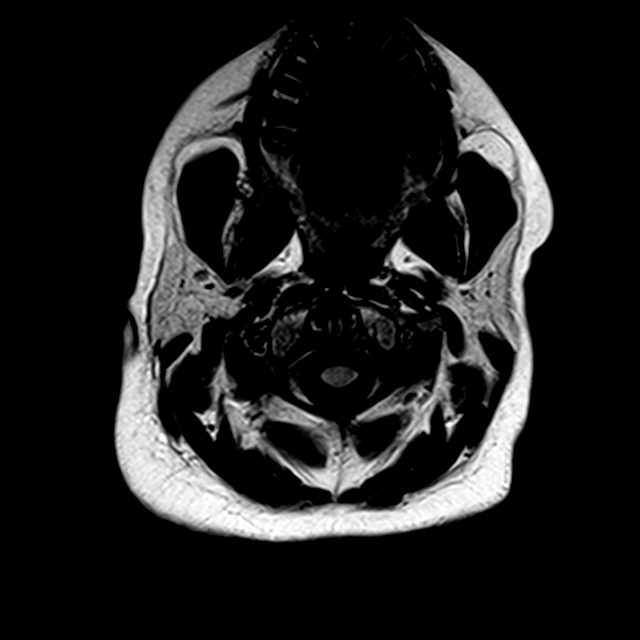
[im 23/23]
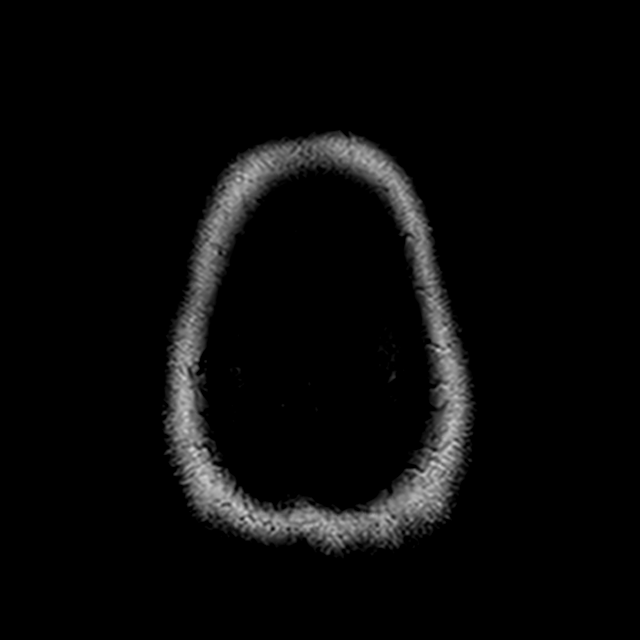

[Series 14: T2 · coronal · 5.0mm · 0.43mm/px · 1 of 28 slices shown (2 of 2)]
[im 1/28]
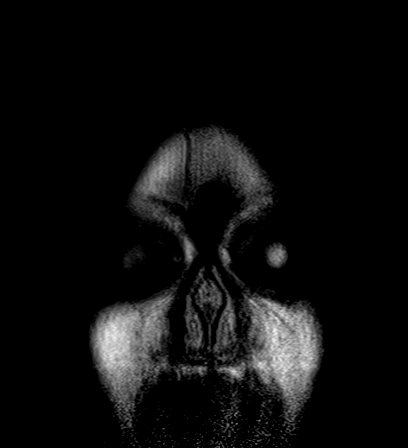

[Series 15: T1 post-contrast · axial · 2.0mm · 0.40mm/px · z∈[-74,+75]mm · 6 of 77 slices shown (1 of 3)]
[im 1/77]
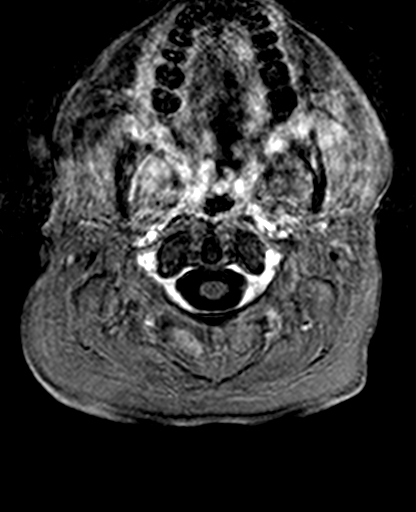
[im 16/77]
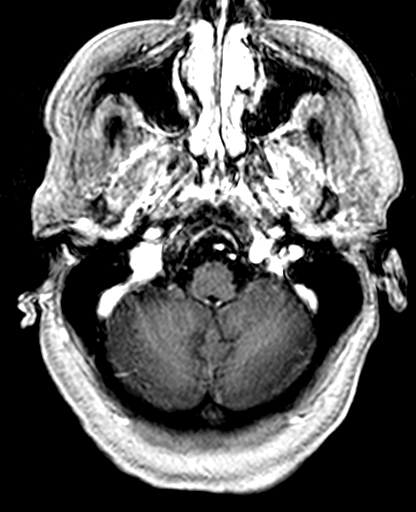
[im 31/77]
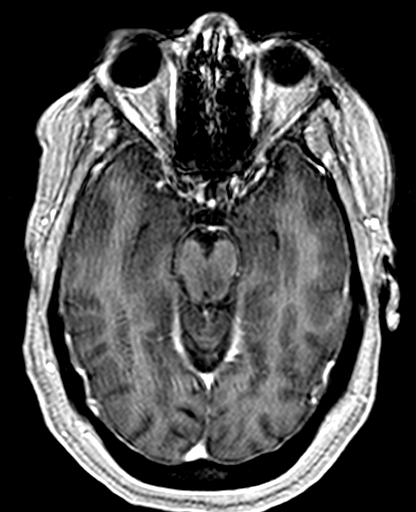
[im 46/77]
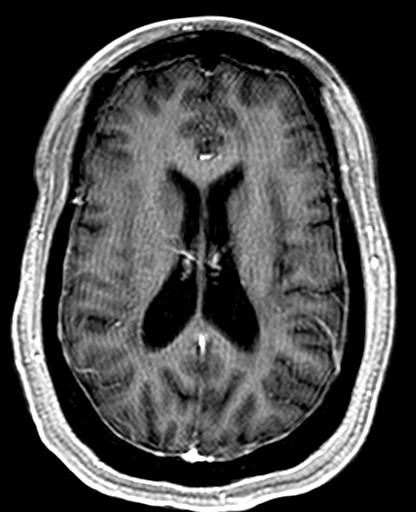
[im 61/77]
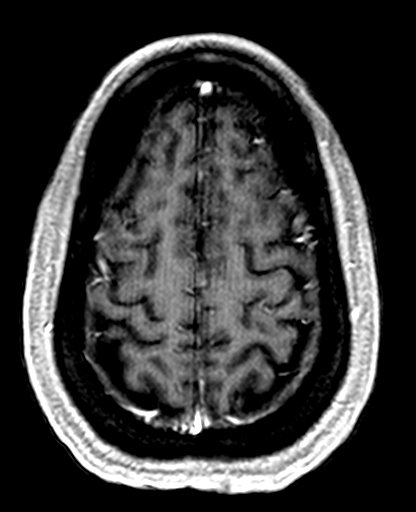
[im 77/77]
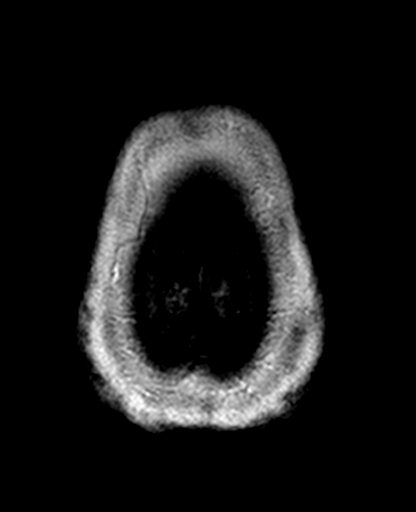

[Series 16: T1 post-contrast · coronal · 5.0mm · 0.37mm/px · 2 of 28 slices shown (2 of 3)]
[im 1/28]
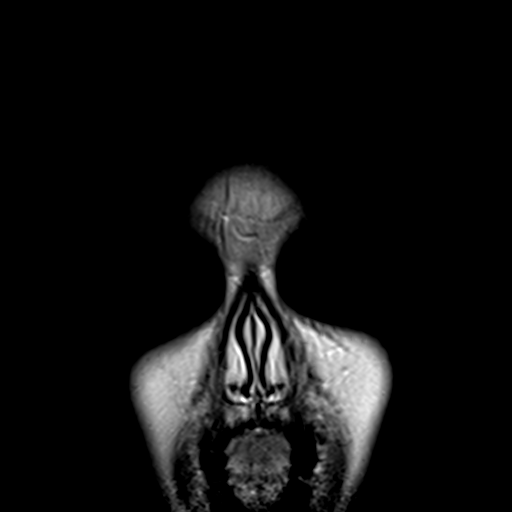
[im 28/28]
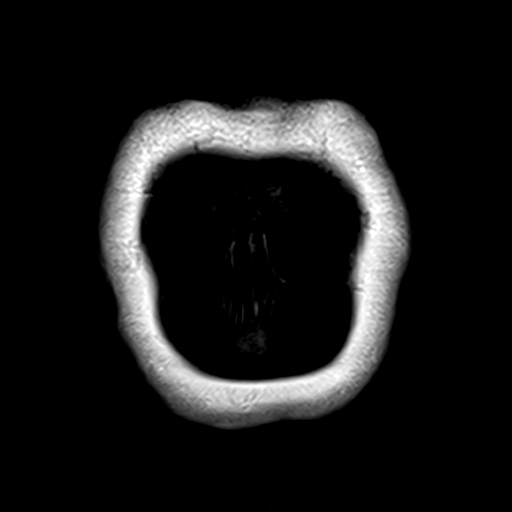

[Series 17: T1 post-contrast · sagittal · 5.0mm · 0.40mm/px · 2 of 21 slices shown (3 of 3)]
[im 1/21]
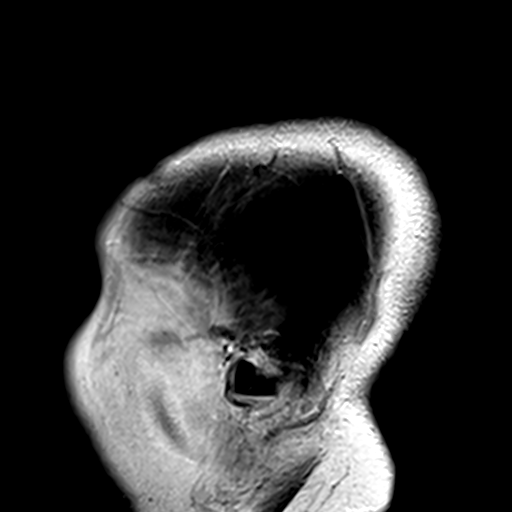
[im 21/21]
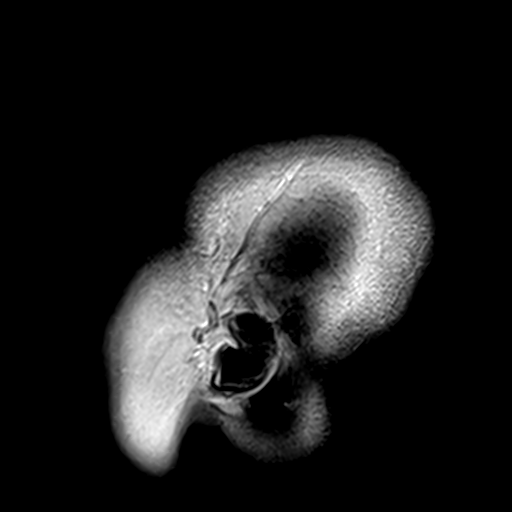

[27 of 48 positions shown; findings below may reference images not displayed]

FINDINGS: The study is mildly motion degraded.

Brain: There is no evidence of acute infarct, intracranial
hemorrhage, mass, midline shift, or extra-axial fluid collection.
The ventricles are slightly prominent in size for age and may
reflect mild cerebral volume loss. There are patchy T2
hyperintensities in the cerebral white matter bilaterally which are
more confluent in the periventricular regions. Multiple lesions are
oriented perpendicularly to the lateral ventricles. There are
juxtacortical lesions. There are lesions in the left middle
cerebellar peduncle and brainstem, including a prominent 1.2 cm
lesion in the midbrain. A left frontal lobe lesion demonstrates T2
shine through on diffusion imaging. No enhancing lesions are
identified.

Vascular: Major intracranial vascular flow voids are preserved.

Skull and upper cervical spine: Unremarkable bone marrow signal.

Sinuses/Orbits: Orbits are unremarkable on this nondedicated study.
Minimal paranasal sinus mucosal thickening. Clear mastoid air cells.

Other: None.
IMPRESSION: Moderately extensive white matter lesions consistent with provided
history of multiple sclerosis. No evidence of active demyelination.
